# Patient Record
Sex: Female | Born: 1965 | Race: White | Hispanic: No | Marital: Married | Smoking: Never smoker
Health system: Southern US, Community
[De-identification: ages and names within clinical notes are randomized; demographics above are authoritative.]

## PROBLEM LIST (undated history)

## (undated) DIAGNOSIS — D649 Anemia, unspecified: Secondary | ICD-10-CM

## (undated) DIAGNOSIS — F419 Anxiety disorder, unspecified: Secondary | ICD-10-CM

## (undated) DIAGNOSIS — Q639 Congenital malformation of kidney, unspecified: Secondary | ICD-10-CM

## (undated) DIAGNOSIS — M1990 Unspecified osteoarthritis, unspecified site: Secondary | ICD-10-CM

## (undated) DIAGNOSIS — M533 Sacrococcygeal disorders, not elsewhere classified: Secondary | ICD-10-CM

## (undated) DIAGNOSIS — C189 Malignant neoplasm of colon, unspecified: Secondary | ICD-10-CM

## (undated) HISTORY — PX: COLON SURGERY: SHX602

## (undated) HISTORY — PX: ABDOMINAL HYSTERECTOMY: SHX81

---

## 2002-04-02 ENCOUNTER — Encounter: Payer: Self-pay | Admitting: Plastic Surgery

## 2002-04-02 ENCOUNTER — Ambulatory Visit (HOSPITAL_COMMUNITY): Admission: RE | Admit: 2002-04-02 | Discharge: 2002-04-02 | Payer: Self-pay | Admitting: Plastic Surgery

## 2002-04-26 HISTORY — PX: AUGMENTATION MAMMAPLASTY: SUR837

## 2006-09-12 ENCOUNTER — Ambulatory Visit: Payer: Self-pay | Admitting: Obstetrics and Gynecology

## 2006-09-16 ENCOUNTER — Ambulatory Visit: Payer: Self-pay | Admitting: Obstetrics and Gynecology

## 2006-09-17 ENCOUNTER — Emergency Department: Payer: Self-pay | Admitting: Emergency Medicine

## 2009-03-28 ENCOUNTER — Emergency Department (HOSPITAL_COMMUNITY): Admission: EM | Admit: 2009-03-28 | Discharge: 2009-03-28 | Payer: Self-pay | Admitting: Emergency Medicine

## 2009-10-29 ENCOUNTER — Ambulatory Visit: Payer: Self-pay

## 2011-08-04 ENCOUNTER — Ambulatory Visit: Payer: Self-pay | Admitting: Obstetrics and Gynecology

## 2011-12-06 ENCOUNTER — Emergency Department (HOSPITAL_COMMUNITY)
Admission: EM | Admit: 2011-12-06 | Discharge: 2011-12-06 | Discharge status: Against Medical Advice | Payer: Self-pay | Attending: Emergency Medicine | Admitting: Emergency Medicine

## 2011-12-06 DIAGNOSIS — R69 Illness, unspecified: Secondary | ICD-10-CM | POA: Insufficient documentation

## 2011-12-07 MED ORDER — ONDANSETRON 4 MG PO TBDP
ORAL_TABLET | ORAL | Status: AC
  Filled 2011-12-07: qty 1

## 2013-03-06 ENCOUNTER — Ambulatory Visit: Payer: Self-pay | Admitting: Obstetrics and Gynecology

## 2013-04-26 DIAGNOSIS — C189 Malignant neoplasm of colon, unspecified: Secondary | ICD-10-CM

## 2013-04-26 HISTORY — DX: Malignant neoplasm of colon, unspecified: C18.9

## 2013-05-28 ENCOUNTER — Ambulatory Visit: Payer: Self-pay | Admitting: Internal Medicine

## 2013-05-29 LAB — CBC CANCER CENTER
BANDS NEUTROPHIL: 1 %
Basophil: 1 %
EOS PCT: 2 %
HCT: 32.9 % — ABNORMAL LOW (ref 35.0–47.0)
HGB: 10.4 g/dL — ABNORMAL LOW (ref 12.0–16.0)
LYMPHS PCT: 34 %
MCH: 30.3 pg (ref 26.0–34.0)
MCHC: 31.7 g/dL — ABNORMAL LOW (ref 32.0–36.0)
MCV: 95 fL (ref 80–100)
Monocytes: 5 %
Platelet: 313 x10 3/mm (ref 150–440)
RBC: 3.44 10*6/uL — AB (ref 3.80–5.20)
RDW: 13.5 % (ref 11.5–14.5)
Segmented Neutrophils: 55 %
VARIANT LYMPHOCYTE - H4-RLYMPH: 2 %
WBC: 7.6 x10 3/mm (ref 3.6–11.0)

## 2013-05-29 LAB — RETICULOCYTES
Absolute Retic Count: 0.0704 10*6/uL (ref 0.019–0.186)
RETICULOCYTE: 2.04 % (ref 0.4–3.1)

## 2013-05-29 LAB — IRON AND TIBC
IRON: 43 ug/dL — AB (ref 50–170)
Iron Bind.Cap.(Total): 375 ug/dL (ref 250–450)
Iron Saturation: 11 %
Unbound Iron-Bind.Cap.: 332 ug/dL

## 2013-05-29 LAB — FERRITIN: Ferritin (ARMC): 9 ng/mL (ref 8–388)

## 2013-05-29 LAB — FOLATE: FOLIC ACID: 7.9 ng/mL (ref 3.1–100.0)

## 2013-05-29 LAB — LACTATE DEHYDROGENASE: LDH: 140 U/L (ref 81–246)

## 2013-05-29 LAB — SEDIMENTATION RATE: Erythrocyte Sed Rate: 14 mm/hr (ref 0–20)

## 2013-05-31 LAB — PROT IMMUNOELECTROPHORES(ARMC)

## 2013-06-24 ENCOUNTER — Ambulatory Visit: Payer: Self-pay | Admitting: Internal Medicine

## 2013-08-10 ENCOUNTER — Ambulatory Visit: Payer: Self-pay | Admitting: Unknown Physician Specialty

## 2013-08-20 ENCOUNTER — Ambulatory Visit: Payer: Self-pay | Admitting: Gastroenterology

## 2013-08-21 ENCOUNTER — Ambulatory Visit: Payer: Self-pay | Admitting: Internal Medicine

## 2013-08-21 LAB — FERRITIN: Ferritin (ARMC): 11 ng/mL (ref 8–388)

## 2013-08-21 LAB — IRON AND TIBC
Iron Bind.Cap.(Total): 324 ug/dL (ref 250–450)
Iron Saturation: 9 %
Iron: 28 ug/dL — ABNORMAL LOW (ref 50–170)
Unbound Iron-Bind.Cap.: 296 ug/dL

## 2013-08-21 LAB — CANCER CENTER HEMOGLOBIN: HGB: 11 g/dL — AB (ref 12.0–16.0)

## 2013-08-24 ENCOUNTER — Ambulatory Visit: Payer: Self-pay | Admitting: Internal Medicine

## 2013-09-24 ENCOUNTER — Ambulatory Visit: Payer: Self-pay | Admitting: Internal Medicine

## 2013-10-16 ENCOUNTER — Ambulatory Visit: Payer: Self-pay

## 2013-11-05 ENCOUNTER — Ambulatory Visit: Payer: Self-pay | Admitting: Internal Medicine

## 2013-11-05 LAB — CBC CANCER CENTER
Basophil #: 0 x10 3/mm (ref 0.0–0.1)
Basophil %: 0.8 %
Eosinophil #: 0.3 x10 3/mm (ref 0.0–0.7)
Eosinophil %: 4.5 %
HCT: 35.8 % (ref 35.0–47.0)
HGB: 11.5 g/dL — ABNORMAL LOW (ref 12.0–16.0)
Lymphocyte #: 1.7 x10 3/mm (ref 1.0–3.6)
Lymphocyte %: 27.7 %
MCH: 29.8 pg (ref 26.0–34.0)
MCHC: 32.1 g/dL (ref 32.0–36.0)
MCV: 93 fL (ref 80–100)
Monocyte #: 0.4 x10 3/mm (ref 0.2–0.9)
Monocyte %: 6.8 %
Neutrophil #: 3.8 x10 3/mm (ref 1.4–6.5)
Neutrophil %: 60.2 %
Platelet: 219 x10 3/mm (ref 150–440)
RBC: 3.86 10*6/uL (ref 3.80–5.20)
RDW: 13.9 % (ref 11.5–14.5)
WBC: 6.3 x10 3/mm (ref 3.6–11.0)

## 2013-11-05 LAB — FERRITIN: Ferritin (ARMC): 32 ng/mL (ref 8–388)

## 2013-11-05 LAB — IRON AND TIBC
Iron Bind.Cap.(Total): 328 ug/dL (ref 250–450)
Iron Saturation: 23 %
Iron: 77 ug/dL (ref 50–170)
Unbound Iron-Bind.Cap.: 251 ug/dL

## 2013-11-23 ENCOUNTER — Ambulatory Visit: Payer: Self-pay | Admitting: Vascular Surgery

## 2013-11-24 ENCOUNTER — Ambulatory Visit: Payer: Self-pay | Admitting: Internal Medicine

## 2013-11-28 LAB — COMPREHENSIVE METABOLIC PANEL
Albumin: 3.5 g/dL (ref 3.4–5.0)
Alkaline Phosphatase: 57 U/L
Anion Gap: 4 — ABNORMAL LOW (ref 7–16)
BUN: 11 mg/dL (ref 7–18)
Bilirubin,Total: 0.3 mg/dL (ref 0.2–1.0)
Calcium, Total: 8.2 mg/dL — ABNORMAL LOW (ref 8.5–10.1)
Chloride: 108 mmol/L — ABNORMAL HIGH (ref 98–107)
Co2: 27 mmol/L (ref 21–32)
Creatinine: 0.72 mg/dL (ref 0.60–1.30)
EGFR (African American): >60
EGFR (Non-African Amer.): >60
Glucose: 87 mg/dL (ref 65–99)
Osmolality: 276 (ref 275–301)
Potassium: 3.6 mmol/L (ref 3.5–5.1)
SGOT(AST): 19 U/L (ref 15–37)
SGPT (ALT): 18 U/L
Sodium: 139 mmol/L (ref 136–145)
Total Protein: 7.3 g/dL (ref 6.4–8.2)

## 2013-11-28 LAB — CBC CANCER CENTER
Basophil #: 0 x10 3/mm (ref 0.0–0.1)
Basophil %: 0.7 %
Eosinophil #: 0.1 x10 3/mm (ref 0.0–0.7)
Eosinophil %: 2.3 %
HCT: 36.3 % (ref 35.0–47.0)
HGB: 11.9 g/dL — ABNORMAL LOW (ref 12.0–16.0)
Lymphocyte #: 1.5 x10 3/mm (ref 1.0–3.6)
Lymphocyte %: 29.1 %
MCH: 30.1 pg (ref 26.0–34.0)
MCHC: 32.9 g/dL (ref 32.0–36.0)
MCV: 92 fL (ref 80–100)
Monocyte #: 0.4 x10 3/mm (ref 0.2–0.9)
Monocyte %: 7 %
Neutrophil #: 3.2 x10 3/mm (ref 1.4–6.5)
Neutrophil %: 60.9 %
Platelet: 195 x10 3/mm (ref 150–440)
RBC: 3.96 10*6/uL (ref 3.80–5.20)
RDW: 13.9 % (ref 11.5–14.5)
WBC: 5.3 x10 3/mm (ref 3.6–11.0)

## 2013-12-05 LAB — CBC CANCER CENTER
Basophil #: 0 x10 3/mm (ref 0.0–0.1)
Basophil %: 0.9 %
Eosinophil #: 0.3 x10 3/mm (ref 0.0–0.7)
Eosinophil %: 5.6 %
HCT: 37.6 % (ref 35.0–47.0)
HGB: 12.3 g/dL (ref 12.0–16.0)
Lymphocyte #: 1.4 x10 3/mm (ref 1.0–3.6)
Lymphocyte %: 28.8 %
MCH: 29.9 pg (ref 26.0–34.0)
MCHC: 32.6 g/dL (ref 32.0–36.0)
MCV: 92 fL (ref 80–100)
Monocyte #: 0.2 x10 3/mm (ref 0.2–0.9)
Monocyte %: 3.8 %
Neutrophil #: 3 x10 3/mm (ref 1.4–6.5)
Neutrophil %: 60.9 %
Platelet: 167 x10 3/mm (ref 150–440)
RBC: 4.1 10*6/uL (ref 3.80–5.20)
RDW: 13.5 % (ref 11.5–14.5)
WBC: 4.9 x10 3/mm (ref 3.6–11.0)

## 2013-12-05 LAB — CREATININE, SERUM
Creatinine: 0.86 mg/dL (ref 0.60–1.30)
EGFR (African American): >60

## 2013-12-12 LAB — CBC CANCER CENTER
Basophil #: 0 x10 3/mm (ref 0.0–0.1)
Basophil %: 0.5 %
EOS ABS: 0.1 x10 3/mm (ref 0.0–0.7)
Eosinophil %: 2.5 %
HCT: 35.6 % (ref 35.0–47.0)
HGB: 11.7 g/dL — AB (ref 12.0–16.0)
LYMPHS PCT: 25 %
Lymphocyte #: 1.3 x10 3/mm (ref 1.0–3.6)
MCH: 30 pg (ref 26.0–34.0)
MCHC: 32.9 g/dL (ref 32.0–36.0)
MCV: 91 fL (ref 80–100)
MONO ABS: 0.5 x10 3/mm (ref 0.2–0.9)
Monocyte %: 8.5 %
Neutrophil #: 3.4 x10 3/mm (ref 1.4–6.5)
Neutrophil %: 63.5 %
Platelet: 194 x10 3/mm (ref 150–440)
RBC: 3.9 10*6/uL (ref 3.80–5.20)
RDW: 13.6 % (ref 11.5–14.5)
WBC: 5.3 x10 3/mm (ref 3.6–11.0)

## 2013-12-12 LAB — BASIC METABOLIC PANEL
Anion Gap: 9 (ref 7–16)
BUN: 7 mg/dL (ref 7–18)
CO2: 27 mmol/L (ref 21–32)
CREATININE: 0.89 mg/dL (ref 0.60–1.30)
Calcium, Total: 8 mg/dL — ABNORMAL LOW (ref 8.5–10.1)
Chloride: 105 mmol/L (ref 98–107)
EGFR (Non-African Amer.): >60
GLUCOSE: 99 mg/dL (ref 65–99)
OSMOLALITY: 279 (ref 275–301)
Potassium: 3.7 mmol/L (ref 3.5–5.1)
SODIUM: 141 mmol/L (ref 136–145)

## 2013-12-12 LAB — HEPATIC FUNCTION PANEL A (ARMC)
ALBUMIN: 3.5 g/dL (ref 3.4–5.0)
AST: 13 U/L — AB (ref 15–37)
Alkaline Phosphatase: 64 U/L
Bilirubin, Direct: 0.1 mg/dL (ref 0.00–0.20)
Bilirubin,Total: 0.4 mg/dL (ref 0.2–1.0)
SGPT (ALT): 18 U/L
Total Protein: 6.9 g/dL (ref 6.4–8.2)

## 2013-12-19 LAB — CBC CANCER CENTER
Basophil #: 0 x10 3/mm (ref 0.0–0.1)
Basophil %: 1.2 %
Eosinophil #: 0.4 x10 3/mm (ref 0.0–0.7)
Eosinophil %: 10.5 %
HCT: 39.4 % (ref 35.0–47.0)
HGB: 12.9 g/dL (ref 12.0–16.0)
LYMPHS PCT: 33.4 %
Lymphocyte #: 1.3 x10 3/mm (ref 1.0–3.6)
MCH: 29.8 pg (ref 26.0–34.0)
MCHC: 32.7 g/dL (ref 32.0–36.0)
MCV: 91 fL (ref 80–100)
Monocyte #: 0.3 x10 3/mm (ref 0.2–0.9)
Monocyte %: 7.6 %
NEUTROS ABS: 1.8 x10 3/mm (ref 1.4–6.5)
Neutrophil %: 47.3 %
PLATELETS: 171 x10 3/mm (ref 150–440)
RBC: 4.33 10*6/uL (ref 3.80–5.20)
RDW: 14.3 % (ref 11.5–14.5)
WBC: 3.8 x10 3/mm (ref 3.6–11.0)

## 2013-12-19 LAB — CREATININE, SERUM
Creatinine: 0.88 mg/dL (ref 0.60–1.30)
EGFR (Non-African Amer.): >60

## 2013-12-25 ENCOUNTER — Ambulatory Visit: Payer: Self-pay | Admitting: Internal Medicine

## 2013-12-26 LAB — HEPATIC FUNCTION PANEL A (ARMC)
ALBUMIN: 3.5 g/dL (ref 3.4–5.0)
Alkaline Phosphatase: 66 U/L
Bilirubin, Direct: <0.05 mg/dL (ref 0.00–0.20)
Bilirubin,Total: 0.3 mg/dL (ref 0.2–1.0)
SGOT(AST): 13 U/L — ABNORMAL LOW (ref 15–37)
SGPT (ALT): 17 U/L
TOTAL PROTEIN: 7.1 g/dL (ref 6.4–8.2)

## 2013-12-26 LAB — CBC CANCER CENTER
Basophil #: 0 x10 3/mm (ref 0.0–0.1)
Basophil %: 0.7 %
EOS ABS: 0.2 x10 3/mm (ref 0.0–0.7)
Eosinophil %: 4.1 %
HCT: 37.9 % (ref 35.0–47.0)
HGB: 12.2 g/dL (ref 12.0–16.0)
LYMPHS ABS: 1.3 x10 3/mm (ref 1.0–3.6)
Lymphocyte %: 25.3 %
MCH: 29.5 pg (ref 26.0–34.0)
MCHC: 32.1 g/dL (ref 32.0–36.0)
MCV: 92 fL (ref 80–100)
MONO ABS: 0.5 x10 3/mm (ref 0.2–0.9)
MONOS PCT: 9.6 %
NEUTROS PCT: 60.3 %
Neutrophil #: 3.1 x10 3/mm (ref 1.4–6.5)
Platelet: 154 x10 3/mm (ref 150–440)
RBC: 4.13 10*6/uL (ref 3.80–5.20)
RDW: 14.4 % (ref 11.5–14.5)
WBC: 5.2 x10 3/mm (ref 3.6–11.0)

## 2013-12-26 LAB — BASIC METABOLIC PANEL
Anion Gap: 7 (ref 7–16)
BUN: 14 mg/dL (ref 7–18)
CO2: 28 mmol/L (ref 21–32)
Calcium, Total: 8.3 mg/dL — ABNORMAL LOW (ref 8.5–10.1)
Chloride: 106 mmol/L (ref 98–107)
Creatinine: 0.87 mg/dL (ref 0.60–1.30)
EGFR (African American): >60
EGFR (Non-African Amer.): >60
GLUCOSE: 91 mg/dL (ref 65–99)
Osmolality: 281 (ref 275–301)
POTASSIUM: 4.2 mmol/L (ref 3.5–5.1)
Sodium: 141 mmol/L (ref 136–145)

## 2014-01-09 LAB — CBC CANCER CENTER
BASOS ABS: 0 x10 3/mm (ref 0.0–0.1)
BASOS PCT: 0.6 %
EOS ABS: 0.2 x10 3/mm (ref 0.0–0.7)
EOS PCT: 4.3 %
HCT: 37.3 % (ref 35.0–47.0)
HGB: 12.2 g/dL (ref 12.0–16.0)
LYMPHS ABS: 1.3 x10 3/mm (ref 1.0–3.6)
Lymphocyte %: 24.6 %
MCH: 30 pg (ref 26.0–34.0)
MCHC: 32.8 g/dL (ref 32.0–36.0)
MCV: 92 fL (ref 80–100)
MONOS PCT: 10.3 %
Monocyte #: 0.5 x10 3/mm (ref 0.2–0.9)
NEUTROS PCT: 60.2 %
Neutrophil #: 3.2 x10 3/mm (ref 1.4–6.5)
PLATELETS: 93 x10 3/mm — AB (ref 150–440)
RBC: 4.07 10*6/uL (ref 3.80–5.20)
RDW: 15.4 % — AB (ref 11.5–14.5)
WBC: 5.3 x10 3/mm (ref 3.6–11.0)

## 2014-01-09 LAB — HEPATIC FUNCTION PANEL A (ARMC)
ALT: 92 U/L — AB
Albumin: 3.5 g/dL (ref 3.4–5.0)
Alkaline Phosphatase: 78 U/L
BILIRUBIN TOTAL: 0.3 mg/dL (ref 0.2–1.0)
Bilirubin, Direct: 0.1 mg/dL (ref 0.00–0.20)
SGOT(AST): 75 U/L — ABNORMAL HIGH (ref 15–37)
TOTAL PROTEIN: 7.3 g/dL (ref 6.4–8.2)

## 2014-01-09 LAB — BASIC METABOLIC PANEL
Anion Gap: 6 — ABNORMAL LOW (ref 7–16)
BUN: 12 mg/dL (ref 7–18)
CHLORIDE: 106 mmol/L (ref 98–107)
CO2: 27 mmol/L (ref 21–32)
CREATININE: 0.83 mg/dL (ref 0.60–1.30)
Calcium, Total: 8.7 mg/dL (ref 8.5–10.1)
EGFR (Non-African Amer.): >60
GLUCOSE: 97 mg/dL (ref 65–99)
OSMOLALITY: 277 (ref 275–301)
Potassium: 3.7 mmol/L (ref 3.5–5.1)
Sodium: 139 mmol/L (ref 136–145)

## 2014-01-16 LAB — CBC CANCER CENTER
BASOS ABS: 0 x10 3/mm (ref 0.0–0.1)
BASOS PCT: 1.1 %
EOS PCT: 14.9 %
Eosinophil #: 0.6 x10 3/mm (ref 0.0–0.7)
HCT: 39.7 % (ref 35.0–47.0)
HGB: 13.1 g/dL (ref 12.0–16.0)
LYMPHS ABS: 1.2 x10 3/mm (ref 1.0–3.6)
Lymphocyte %: 30.1 %
MCH: 30.2 pg (ref 26.0–34.0)
MCHC: 32.9 g/dL (ref 32.0–36.0)
MCV: 92 fL (ref 80–100)
MONO ABS: 0.3 x10 3/mm (ref 0.2–0.9)
Monocyte %: 6.8 %
Neutrophil #: 1.8 x10 3/mm (ref 1.4–6.5)
Neutrophil %: 47.1 %
Platelet: 31 x10 3/mm — ABNORMAL LOW (ref 150–440)
RBC: 4.33 10*6/uL (ref 3.80–5.20)
RDW: 16 % — ABNORMAL HIGH (ref 11.5–14.5)
WBC: 3.9 x10 3/mm (ref 3.6–11.0)

## 2014-01-16 LAB — HEPATIC FUNCTION PANEL A (ARMC)
ALK PHOS: 80 U/L
Albumin: 3.6 g/dL (ref 3.4–5.0)
BILIRUBIN DIRECT: 0.1 mg/dL (ref 0.00–0.20)
Bilirubin,Total: 0.4 mg/dL (ref 0.2–1.0)
SGOT(AST): 48 U/L — ABNORMAL HIGH (ref 15–37)
SGPT (ALT): 91 U/L — ABNORMAL HIGH
Total Protein: 7.2 g/dL (ref 6.4–8.2)

## 2014-01-23 LAB — CBC CANCER CENTER
Basophil #: 0 x10 3/mm (ref 0.0–0.1)
Basophil %: 0.8 %
EOS PCT: 5.9 %
Eosinophil #: 0.2 x10 3/mm (ref 0.0–0.7)
HCT: 37.9 % (ref 35.0–47.0)
HGB: 12.3 g/dL (ref 12.0–16.0)
Lymphocyte #: 1.3 x10 3/mm (ref 1.0–3.6)
Lymphocyte %: 31.3 %
MCH: 30 pg (ref 26.0–34.0)
MCHC: 32.4 g/dL (ref 32.0–36.0)
MCV: 93 fL (ref 80–100)
Monocyte #: 0.5 x10 3/mm (ref 0.2–0.9)
Monocyte %: 11.8 %
Neutrophil #: 2 x10 3/mm (ref 1.4–6.5)
Neutrophil %: 50.2 %
Platelet: 73 x10 3/mm — ABNORMAL LOW (ref 150–440)
RBC: 4.09 10*6/uL (ref 3.80–5.20)
RDW: 17.3 % — ABNORMAL HIGH (ref 11.5–14.5)
WBC: 4.1 x10 3/mm (ref 3.6–11.0)

## 2014-01-23 LAB — BASIC METABOLIC PANEL
Anion Gap: 11 (ref 7–16)
BUN: 10 mg/dL (ref 7–18)
CREATININE: 0.8 mg/dL (ref 0.60–1.30)
Calcium, Total: 9 mg/dL (ref 8.5–10.1)
Chloride: 104 mmol/L (ref 98–107)
Co2: 26 mmol/L (ref 21–32)
EGFR (African American): >60
EGFR (Non-African Amer.): >60
Glucose: 92 mg/dL (ref 65–99)
OSMOLALITY: 280 (ref 275–301)
Potassium: 3.9 mmol/L (ref 3.5–5.1)
Sodium: 141 mmol/L (ref 136–145)

## 2014-01-23 LAB — HEPATIC FUNCTION PANEL A (ARMC)
ALBUMIN: 4 g/dL (ref 3.4–5.0)
ALK PHOS: 75 U/L
AST: 68 U/L — AB (ref 15–37)
BILIRUBIN DIRECT: 0.1 mg/dL (ref 0.00–0.20)
Bilirubin,Total: 0.4 mg/dL (ref 0.2–1.0)
SGPT (ALT): 103 U/L — ABNORMAL HIGH
TOTAL PROTEIN: 7.6 g/dL (ref 6.4–8.2)

## 2014-01-24 ENCOUNTER — Ambulatory Visit: Payer: Self-pay | Admitting: Internal Medicine

## 2014-01-30 LAB — CBC CANCER CENTER
BASOS ABS: 0 x10 3/mm (ref 0.0–0.1)
Basophil %: 1.4 %
Eosinophil #: 0.1 x10 3/mm (ref 0.0–0.7)
Eosinophil %: 4.9 %
HCT: 40 % (ref 35.0–47.0)
HGB: 13.1 g/dL (ref 12.0–16.0)
LYMPHS ABS: 1.2 x10 3/mm (ref 1.0–3.6)
Lymphocyte %: 41.4 %
MCH: 30.9 pg (ref 26.0–34.0)
MCHC: 32.8 g/dL (ref 32.0–36.0)
MCV: 94 fL (ref 80–100)
MONO ABS: 0.4 x10 3/mm (ref 0.2–0.9)
MONOS PCT: 14.3 %
NEUTROS PCT: 38 %
Neutrophil #: 1.1 x10 3/mm — ABNORMAL LOW (ref 1.4–6.5)
Platelet: 121 x10 3/mm — ABNORMAL LOW (ref 150–440)
RBC: 4.24 10*6/uL (ref 3.80–5.20)
RDW: 19.1 % — AB (ref 11.5–14.5)
WBC: 2.9 x10 3/mm — ABNORMAL LOW (ref 3.6–11.0)

## 2014-01-30 LAB — HEPATIC FUNCTION PANEL A (ARMC)
ALBUMIN: 4.1 g/dL (ref 3.4–5.0)
ALK PHOS: 71 U/L
ALT: 97 U/L — AB
AST: 45 U/L — AB (ref 15–37)
BILIRUBIN DIRECT: 0.1 mg/dL (ref 0.00–0.20)
Bilirubin,Total: 0.5 mg/dL (ref 0.2–1.0)
TOTAL PROTEIN: 7.6 g/dL (ref 6.4–8.2)

## 2014-01-30 LAB — CREATININE, SERUM
CREATININE: 0.82 mg/dL (ref 0.60–1.30)
EGFR (African American): >60
EGFR (Non-African Amer.): >60

## 2014-02-20 LAB — CBC CANCER CENTER
BASOS ABS: 0 x10 3/mm (ref 0.0–0.1)
Basophil %: 0.8 %
EOS ABS: 0.2 x10 3/mm (ref 0.0–0.7)
Eosinophil %: 3.4 %
HCT: 35.8 % (ref 35.0–47.0)
HGB: 11.7 g/dL — AB (ref 12.0–16.0)
LYMPHS ABS: 1.3 x10 3/mm (ref 1.0–3.6)
LYMPHS PCT: 27.8 %
MCH: 31.8 pg (ref 26.0–34.0)
MCHC: 32.6 g/dL (ref 32.0–36.0)
MCV: 98 fL (ref 80–100)
MONO ABS: 0.3 x10 3/mm (ref 0.2–0.9)
Monocyte %: 6.7 %
NEUTROS ABS: 2.9 x10 3/mm (ref 1.4–6.5)
NEUTROS PCT: 61.3 %
Platelet: 178 x10 3/mm (ref 150–440)
RBC: 3.67 10*6/uL — ABNORMAL LOW (ref 3.80–5.20)
RDW: 17.6 % — ABNORMAL HIGH (ref 11.5–14.5)
WBC: 4.7 x10 3/mm (ref 3.6–11.0)

## 2014-02-20 LAB — HEPATIC FUNCTION PANEL A (ARMC)
Albumin: 3.9 g/dL (ref 3.4–5.0)
Alkaline Phosphatase: 70 U/L
BILIRUBIN DIRECT: 0.1 mg/dL (ref 0.0–0.2)
BILIRUBIN TOTAL: 0.6 mg/dL (ref 0.2–1.0)
SGOT(AST): 23 U/L (ref 15–37)
SGPT (ALT): 26 U/L
Total Protein: 7.5 g/dL (ref 6.4–8.2)

## 2014-02-20 LAB — IRON AND TIBC
IRON BIND. CAP.(TOTAL): 299 ug/dL (ref 250–450)
Iron Saturation: 36 %
Iron: 109 ug/dL (ref 50–170)
UNBOUND IRON-BIND. CAP.: 190 ug/dL

## 2014-02-21 LAB — CEA: CEA: 1.3 ng/mL (ref 0.0–4.7)

## 2014-02-24 ENCOUNTER — Ambulatory Visit: Payer: Self-pay | Admitting: Internal Medicine

## 2014-04-02 ENCOUNTER — Ambulatory Visit: Payer: Self-pay | Admitting: Internal Medicine

## 2014-04-26 ENCOUNTER — Ambulatory Visit: Payer: Self-pay | Admitting: Internal Medicine

## 2014-04-26 HISTORY — PX: PORT-A-CATH REMOVAL: SHX5289

## 2014-05-29 ENCOUNTER — Ambulatory Visit: Payer: Self-pay | Admitting: Internal Medicine

## 2014-05-29 LAB — IRON AND TIBC
Iron Bind.Cap.(Total): 302 ug/dL (ref 250–450)
Iron Saturation: 39 %
Iron: 117 ug/dL (ref 50–170)
UNBOUND IRON-BIND. CAP.: 185 ug/dL

## 2014-05-29 LAB — CBC CANCER CENTER
BASOS ABS: 0 x10 3/mm (ref 0.0–0.1)
Basophil %: 0.7 %
Eosinophil #: 0.1 x10 3/mm (ref 0.0–0.7)
Eosinophil %: 1.7 %
HCT: 37.1 % (ref 35.0–47.0)
HGB: 12.4 g/dL (ref 12.0–16.0)
Lymphocyte #: 1.5 x10 3/mm (ref 1.0–3.6)
Lymphocyte %: 24.5 %
MCH: 31.3 pg (ref 26.0–34.0)
MCHC: 33.5 g/dL (ref 32.0–36.0)
MCV: 94 fL (ref 80–100)
MONOS PCT: 5.7 %
Monocyte #: 0.4 x10 3/mm (ref 0.2–0.9)
Neutrophil #: 4.3 x10 3/mm (ref 1.4–6.5)
Neutrophil %: 67.4 %
PLATELETS: 196 x10 3/mm (ref 150–440)
RBC: 3.97 10*6/uL (ref 3.80–5.20)
RDW: 12.3 % (ref 11.5–14.5)
WBC: 6.3 x10 3/mm (ref 3.6–11.0)

## 2014-05-29 LAB — HEPATIC FUNCTION PANEL A (ARMC)
AST: 15 U/L (ref 15–37)
Albumin: 3.8 g/dL (ref 3.4–5.0)
Alkaline Phosphatase: 61 U/L (ref 46–116)
Bilirubin, Direct: 0.1 mg/dL (ref 0.0–0.2)
Bilirubin,Total: 0.5 mg/dL (ref 0.2–1.0)
SGPT (ALT): 23 U/L (ref 14–63)
Total Protein: 7.2 g/dL (ref 6.4–8.2)

## 2014-05-29 LAB — FOLATE: Folic Acid: 19.6 ng/mL — ABNORMAL HIGH (ref 3.1–17.5)

## 2014-05-29 LAB — CREATININE, SERUM
Creatinine: 0.66 mg/dL (ref 0.60–1.30)
EGFR (African American): >60

## 2014-05-29 LAB — FERRITIN: Ferritin (ARMC): 30 ng/mL (ref 8–388)

## 2014-05-30 LAB — CEA: CEA: 0.9 ng/mL (ref 0.0–4.7)

## 2014-06-25 ENCOUNTER — Ambulatory Visit
Admit: 2014-06-25 | Disposition: A | Discharge status: Home / Self Care | Payer: Self-pay | Attending: Internal Medicine | Admitting: Internal Medicine

## 2014-08-09 ENCOUNTER — Other Ambulatory Visit: Payer: Self-pay | Admitting: Internal Medicine

## 2014-08-09 DIAGNOSIS — C189 Malignant neoplasm of colon, unspecified: Secondary | ICD-10-CM

## 2014-08-17 NOTE — Op Note (Signed)
PATIENT NAME:  Brittany Horne, Brittany Horne MR#:  [id number] DATE OF BIRTH:  04-28-1965  DATE OF PROCEDURE:  11/23/2013  PREOPERATIVE DIAGNOSIS: Colon cancer.   POSTOPERATIVE DIAGNOSIS:  Colon cancer.  PROCEDURE PERFORMED: Insertion of right internal jugular Infuse-a-Port with ultrasound and fluoroscopic guidance.   SURGEON:  Hortencia Pilar, MD.   SEDATION: Versed 7 mg plus fentanyl 300 mcg administered IV. Continuous ECG, pulse oximetry and cardiopulmonary monitoring is performed throughout the entire procedure by the interventional radiology nurse. Total sedation time was 25 minutes.   ACCESS: Right IJ.   FLUOROSCOPY TIME: Approximately 1 minute.   CONTRAST: None.   INDICATIONS: Brittany Horne is a 49 year old woman who presents with a recent diagnosis of colon cancer and is undergoing adjuvant chemotherapy. She, therefore, requires adequate IV access.  Risks and benefits were reviewed. All questions answered. The patient has agreed to proceed with port placement.   DESCRIPTION OF PROCEDURE: The patient is taken to the special procedure suite, placed in the supine position. After adequate sedation is achieved, her right neck and chest wall are prepped and draped in a sterile fashion. Lidocaine 1% is then infiltrated in the soft tissues on the chest wall, as well as at the base of the neck. Ultrasound is placed in a sterile sleeve. Jugular vein is identified. It is echolucent and compressible indicating patency. Images recorded. Under real-time visualization, microneedle is inserted into the jugular vein. Microwire is advanced, followed by the micro sheath. A small incision is made at the wire insertion site and a pocket created with blunt dissection. Linear incision is then made 3 fingerbreadths below the clavicle just to the left of the midline and a pocket fashioned with both blunt and sharp dissection. It is tested for appropriate size and, subsequently, the catheter is pulled subcutaneously. A J-wire is  then advanced through the micro sheath, followed by the dilator with the peel-away sheath. Wire and dilator are removed, and the catheter is inserted into the central venous system. Under fluoroscopic guidance, the catheter is positioned with its tip at the atrio-caval junction. It is then transected and the hub is connected.  It is slipped into the pocket without difficulty. The port is then accessed percutaneously with a Huber needle. It aspirates well and flushes easily, and under fluoroscopy, it a smooth contour with the tip in good position.   The subcutaneous tissues are then reapproximated with interrupted 3-0 Vicryl and the skin is closed with 4-0 Monocryl subcuticular. The counterincision is closed with 4-0 Monocryl subcuticular.  Dermabond is applied. The patient tolerated the procedure well. There were no immediate complications, and she is taken to the recovery area in excellent condition.   ____________________________ Katha Cabal, MD ggs:ts D: 11/23/2013 11:52:56 ET T: 11/23/2013 13:41:55 ET JOB#: [id number]  cc: Katha Cabal, MD, <Dictator> Sandeep R. Ma Hillock, MD Ms. Rosine Door Katha Cabal MD ELECTRONICALLY SIGNED 12/04/2013 17:16

## 2014-08-21 ENCOUNTER — Ambulatory Visit
Admit: 2014-08-21 | Disposition: A | Discharge status: Home / Self Care | Payer: Self-pay | Attending: Internal Medicine | Admitting: Internal Medicine

## 2014-09-05 ENCOUNTER — Ambulatory Visit: Payer: No Typology Code available for payment source | Admitting: Anesthesiology

## 2014-09-05 ENCOUNTER — Encounter: Payer: Self-pay | Admitting: *Deleted

## 2014-09-05 ENCOUNTER — Encounter
Admission: RE | Disposition: A | Discharge status: Home / Self Care | Payer: Self-pay | Source: Ambulatory Visit | Attending: Gastroenterology

## 2014-09-05 ENCOUNTER — Ambulatory Visit
Admission: RE | Admit: 2014-09-05 | Discharge: 2014-09-05 | Disposition: A | Discharge status: Home / Self Care | Payer: No Typology Code available for payment source | Source: Ambulatory Visit | Attending: Gastroenterology | Admitting: Gastroenterology

## 2014-09-05 DIAGNOSIS — Z9221 Personal history of antineoplastic chemotherapy: Secondary | ICD-10-CM | POA: Insufficient documentation

## 2014-09-05 DIAGNOSIS — Z79899 Other long term (current) drug therapy: Secondary | ICD-10-CM | POA: Diagnosis not present

## 2014-09-05 DIAGNOSIS — Z85038 Personal history of other malignant neoplasm of large intestine: Secondary | ICD-10-CM | POA: Diagnosis present

## 2014-09-05 DIAGNOSIS — Z90710 Acquired absence of both cervix and uterus: Secondary | ICD-10-CM | POA: Diagnosis not present

## 2014-09-05 DIAGNOSIS — K635 Polyp of colon: Secondary | ICD-10-CM | POA: Diagnosis not present

## 2014-09-05 HISTORY — PX: COLONOSCOPY WITH PROPOFOL: SHX5780

## 2014-09-05 SURGERY — COLONOSCOPY WITH PROPOFOL
Anesthesia: General

## 2014-09-05 MED ORDER — PROPOFOL 10 MG/ML IV BOLUS
INTRAVENOUS | Status: DC | PRN
  Administered 2014-09-05: 50 mg via INTRAVENOUS

## 2014-09-05 MED ORDER — PROPOFOL INFUSION 10 MG/ML OPTIME
INTRAVENOUS | Status: DC | PRN
  Administered 2014-09-05: 75 ug/kg/min via INTRAVENOUS

## 2014-09-05 MED ORDER — SODIUM CHLORIDE 0.9 % IV SOLN
INTRAVENOUS | Status: DC
  Administered 2014-09-05: 08:00:00 via INTRAVENOUS
  Administered 2014-09-05: 1000 mL via INTRAVENOUS

## 2014-09-05 MED ORDER — LIDOCAINE HCL (CARDIAC) 20 MG/ML IV SOLN
INTRAVENOUS | Status: DC | PRN
Start: 2014-09-05 — End: 2014-09-05
  Administered 2014-09-05: 50 mg via INTRAVENOUS

## 2014-09-05 MED ORDER — FENTANYL CITRATE (PF) 100 MCG/2ML IJ SOLN
INTRAMUSCULAR | Status: DC | PRN
  Administered 2014-09-05: 50 ug via INTRAVENOUS

## 2014-09-05 NOTE — Anesthesia Postprocedure Evaluation (Signed)
  Anesthesia Post-op Note  Patient: Brittany Horne  Procedure(s) Performed: Procedure(s): COLONOSCOPY WITH PROPOFOL (N/A)  Anesthesia type:General  Patient location: PACU  Post pain: Pain level controlled  Post assessment: Post-op Vital signs reviewed, Patient's Cardiovascular Status Stable, Respiratory Function Stable, Patent Airway and No signs of Nausea or vomiting  Post vital signs: Reviewed and stable  Last Vitals:  Filed Vitals:   09/05/14 0930  BP: 115/80  Pulse: 63  Temp:   Resp: 12    Level of consciousness: awake, alert  and patient cooperative  Complications: No apparent anesthesia complications

## 2014-09-05 NOTE — Discharge Instructions (Signed)

## 2014-09-05 NOTE — Transfer of Care (Signed)
Immediate Anesthesia Transfer of Care Note  Patient: Brittany Horne  Procedure(s) Performed: Procedure(s): COLONOSCOPY WITH PROPOFOL (N/A)  Patient Location: PACU and Endoscopy Unit  Anesthesia Type:General  Level of Consciousness: awake, alert  and oriented  Airway & Oxygen Therapy: Patient Spontanous Breathing and Patient connected to nasal cannula oxygen  Post-op Assessment: Report given to RN and Post -op Vital signs reviewed and stable  Post vital signs: Reviewed and stable  Last Vitals:  Filed Vitals:   09/05/14 0901  BP: 111/54  Pulse: 74  Temp: 35.7 C  Resp: 14    Complications: No apparent anesthesia complications

## 2014-09-05 NOTE — Op Note (Signed)
[facility] Gastroenterology Patient Name: Brittany Horne Procedure Date: 09/05/2014 7:49 AM MRN: [id number] Account #: [id number] Date of Birth: 10/29/1965 Admit Type: Outpatient Age: 49 Room: [facility] ENDO ROOM 1 Gender: Female Note Status: Finalized Procedure:         Colonoscopy Indications:       High risk colon cancer surveillance: Personal history of                     colon cancer (cecum, diagnosed 08/2013) Patient Profile:   This is a 49 year old female. Providers:         Gerrit Heck. Rayann Heman, MD Referring MD:      Irven Easterly. Kary Kos, MD (Referring MD) Medicines:         Propofol per Anesthesia Complications:     No immediate complications. Procedure:         Pre-Anesthesia Assessment:                    - Prior to the procedure, a History and Physical was                     performed, and patient medications and allergies were                     reviewed. The patient is competent. The risks and benefits                     of the procedure and the sedation options and risks were                     discussed with the patient. All questions were answered                     and informed consent was obtained. Patient identification                     and proposed procedure were verified by the physician and                     the nurse in the pre-procedure area. Mental Status                     Examination: alert and oriented. Airway Examination:                     normal oropharyngeal airway and neck mobility. Respiratory                     Examination: clear to auscultation. CV Examination: RRR,                     no murmurs, no S3 or S4. Prophylactic Antibiotics: The                     patient does not require prophylactic antibiotics. Prior                     Anticoagulants: The patient has taken no previous                     anticoagulant or antiplatelet agents. ASA Grade                     Assessment: II - A patient with mild systemic  disease.                  After reviewing the risks and benefits, the patient was                     deemed in satisfactory condition to undergo the procedure.                     The anesthesia plan was to use monitored anesthesia care                     (MAC). Immediately prior to administration of medications,                     the patient was re-assessed for adequacy to receive                     sedatives. The heart rate, respiratory rate, oxygen                     saturations, blood pressure, adequacy of pulmonary                     ventilation, and response to care were monitored                     throughout the procedure. The physical status of the                     patient was re-assessed after the procedure.                    After obtaining informed consent, the colonoscope was                     passed under direct vision. Throughout the procedure, the                     patient's blood pressure, pulse, and oxygen saturations                     were monitored continuously. The Colonoscope was                     introduced through the anus and advanced to the the                     ileocolonic anastomosis. The colonoscopy was somewhat                     difficult due to a tortuous colon. Successful completion                     of the procedure was aided by changing the patient to a                     supine position. The patient tolerated the procedure well.                     The quality of the bowel preparation was excellent. Findings:      The perianal and digital rectal examinations were normal.      A 2 mm polyp was found in the recto-sigmoid colon. The polyp was       sessile. The polyp was removed with a jumbo cold forceps. Resection and  retrieval were complete.      There was evidence of a prior end-to-side ileo-colonic anastomosis in       the transverse colon. This was patent. This was characterized by healthy       appearing mucosa. This was  traversed.      The exam was otherwise without abnormality on direct and retroflexion       views. Impression:        - One 2 mm polyp at the recto-sigmoid colon. Resected and                     retrieved.                    - Patent end-to-side ileo-colonic anastomosis,                     characterized by healthy appearing mucosa.                    - The examination was otherwise normal on direct and                     retroflexion views. Recommendation:    - Observe patient in GI recovery unit.                    - High fiber diet.                    - Continue present medications.                    - Await pathology results.                    - Repeat colonoscopy in 2 years for surveillance.                    - Return to referring physician.                    - The findings and recommendations were discussed with the                     patient.                    - The findings and recommendations were discussed with the                     patient's family. Procedure Code(s): --- Professional ---                    [phone], Colonoscopy, flexible; with biopsy, single or                     multiple CPT copyright 2014 [employer]. All rights reserved. The codes documented in this report are preliminary and upon coder review may  be revised to meet current compliance requirements. Mellody Life, MD 09/05/2014 8:57:21 AM This report has been signed electronically. Number of Addenda: 0 Note Initiated On: 09/05/2014 7:49 AM Scope Withdrawal Time: 0 hours 12 minutes 25 seconds  Total Procedure Duration: 0 hours 23 minutes 37 seconds       [facility]

## 2014-09-05 NOTE — Anesthesia Preprocedure Evaluation (Signed)
Anesthesia Evaluation  Patient identified by MRN, date of birth, ID band Patient awake    Reviewed: Allergy & Precautions, H&P , NPO status , Patient's Chart, lab work & pertinent test results, reviewed documented beta blocker date and time   Airway Mallampati: II  TM Distance: >3 FB Neck ROM: full    Dental no notable dental hx.    Pulmonary neg pulmonary ROS,  breath sounds clear to auscultation  Pulmonary exam normal       Cardiovascular Exercise Tolerance: Good negative cardio ROS  Rhythm:regular Rate:Normal     Neuro/Psych negative neurological ROS  negative psych ROS   GI/Hepatic negative GI ROS, Neg liver ROS,   Endo/Other  negative endocrine ROS  Renal/GU negative Renal ROS  negative genitourinary   Musculoskeletal   Abdominal   Peds  Hematology negative hematology ROS (+)   Anesthesia Other Findings   Reproductive/Obstetrics negative OB ROS                             Anesthesia Physical Anesthesia Plan  ASA: II  Anesthesia Plan: General   Post-op Pain Management:    Induction:   Airway Management Planned:   Additional Equipment:   Intra-op Plan:   Post-operative Plan:   Informed Consent: I have reviewed the patients History and Physical, chart, labs and discussed the procedure including the risks, benefits and alternatives for the proposed anesthesia with the patient or authorized representative who has indicated his/her understanding and acceptance.   Dental Advisory Given  Plan Discussed with: CRNA  Anesthesia Plan Comments:         Anesthesia Quick Evaluation

## 2014-09-05 NOTE — H&P (Signed)
Primary Care Physician:  [place] Pink, MD  Pre-Procedure History & Physical: HPI:  Brittany Horne is a 49 y.o. female is here for an colonoscopy.   Past Medical History  Diagnosis Date  . Cancer     colon cancer    Past Surgical History  Procedure Laterality Date  . Colon surgery    . Abdominal hysterectomy    . Cesarean section      Prior to Admission medications   Medication Sig Start Date End Date Taking? Authorizing Provider  Ascorbic Acid (VITAMIN C PO) Take 1 tablet by mouth daily.   Yes Historical Provider, MD  b complex vitamins tablet Take 1 tablet by mouth daily.   Yes Historical Provider, MD  BIOTIN PO Take 1 Dose by mouth daily.   Yes Historical Provider, MD  buPROPion (WELLBUTRIN XL) 150 MG 24 hr tablet Take 150 mg by mouth daily.   Yes Historical Provider, MD  cholecalciferol (VITAMIN D) 1000 UNITS tablet Take 1,000 Units by mouth daily.   Yes Historical Provider, MD  CO-ENZYME Q10 PO Take 1 Dose by mouth daily.   Yes Historical Provider, MD  estradiol (CLIMARA - DOSED IN MG/24 HR) 0.075 mg/24hr patch Place 0.075 mg onto the skin once a week.   Yes Historical Provider, MD  NON FORMULARY Take 1 Dose by mouth daily. Pregnenolone   Yes Historical Provider, MD  polyethylene glycol powder (GLYCOLAX/MIRALAX) powder Take 1 Container by mouth once.   Yes Historical Provider, MD  Probiotic Product (PROBIOTIC DAILY PO) Take 1 tablet by mouth daily.   Yes Historical Provider, MD  progesterone (PROMETRIUM) 100 MG capsule Take 100 mg by mouth daily.   Yes Historical Provider, MD  SELENIUM PO Take 1 Dose by mouth daily.   Yes Historical Provider, MD  TURMERIC PO Take 1 Dose by mouth daily.   Yes Historical Provider, MD  VITAMIN K, PHYTONADIONE, PO Take 1 tablet by mouth daily.    Yes Historical Provider, MD    Allergies as of 09/03/2014  . (No Known Allergies)    History reviewed. No pertinent family history.  History   Social History  . Marital Status: Married   Spouse Name: N/A  . Number of Children: N/A  . Years of Education: N/A   Occupational History  . Not on file.   Social History Main Topics  . Smoking status: Never Smoker   . Smokeless tobacco: Not on file  . Alcohol Use: Not on file  . Drug Use: Not on file  . Sexual Activity: Not on file   Other Topics Concern  . Not on file   Social History Narrative  . No narrative on file     Physical Exam: BP 113/61 mmHg  Pulse 73  Temp(Src) 97.8 F (36.6 C) (Tympanic)  Resp 20  Ht 5\' 7"  (1.702 m)  Wt 146 lb (66.225 kg)  BMI 22.86 kg/m2  SpO2 100% General:   Alert,  pleasant and cooperative in NAD Head:  Normocephalic and atraumatic. Neck:  Supple; no masses or thyromegaly. Lungs:  Clear throughout to auscultation.    Heart:  Regular rate and rhythm. Abdomen:  Soft, nontender and nondistended. Normal bowel sounds, without guarding, and without rebound.   Neurologic:  Alert and  oriented x4;  grossly normal neurologically.  Impression/Plan: Brittany Horne is here for an colonoscopy to be performed for colon cancer sureillance  Risks, benefits, limitations, and alternatives regarding  colonoscopy have been reviewed with the patient.  Questions have  been answered.  All parties agreeable.   Josefine Class, MD  09/05/2014, 8:16 AM

## 2014-09-06 LAB — SURGICAL PATHOLOGY

## 2014-09-19 ENCOUNTER — Other Ambulatory Visit: Payer: Self-pay | Admitting: Obstetrics and Gynecology

## 2014-09-19 DIAGNOSIS — Z13820 Encounter for screening for osteoporosis: Secondary | ICD-10-CM

## 2014-09-19 DIAGNOSIS — Z1231 Encounter for screening mammogram for malignant neoplasm of breast: Secondary | ICD-10-CM

## 2014-09-23 ENCOUNTER — Other Ambulatory Visit: Payer: Self-pay | Admitting: *Deleted

## 2014-09-23 DIAGNOSIS — C189 Malignant neoplasm of colon, unspecified: Secondary | ICD-10-CM

## 2014-09-23 DIAGNOSIS — D509 Iron deficiency anemia, unspecified: Secondary | ICD-10-CM

## 2014-09-23 DIAGNOSIS — Z95828 Presence of other vascular implants and grafts: Secondary | ICD-10-CM

## 2014-09-24 ENCOUNTER — Other Ambulatory Visit: Payer: Self-pay | Admitting: *Deleted

## 2014-09-24 DIAGNOSIS — C189 Malignant neoplasm of colon, unspecified: Secondary | ICD-10-CM

## 2014-09-24 DIAGNOSIS — D509 Iron deficiency anemia, unspecified: Secondary | ICD-10-CM

## 2014-09-25 ENCOUNTER — Inpatient Hospital Stay: Payer: No Typology Code available for payment source

## 2014-09-25 ENCOUNTER — Inpatient Hospital Stay: Payer: No Typology Code available for payment source | Attending: Internal Medicine

## 2014-09-25 ENCOUNTER — Ambulatory Visit
Admission: RE | Admit: 2014-09-25 | Discharge: 2014-09-25 | Disposition: A | Discharge status: Home / Self Care | Payer: No Typology Code available for payment source | Source: Ambulatory Visit | Attending: Internal Medicine | Admitting: Internal Medicine

## 2014-09-25 ENCOUNTER — Telehealth: Payer: Self-pay | Admitting: *Deleted

## 2014-09-25 DIAGNOSIS — Z803 Family history of malignant neoplasm of breast: Secondary | ICD-10-CM | POA: Diagnosis not present

## 2014-09-25 DIAGNOSIS — C189 Malignant neoplasm of colon, unspecified: Secondary | ICD-10-CM | POA: Insufficient documentation

## 2014-09-25 DIAGNOSIS — C801 Malignant (primary) neoplasm, unspecified: Secondary | ICD-10-CM

## 2014-09-25 DIAGNOSIS — Z85038 Personal history of other malignant neoplasm of large intestine: Secondary | ICD-10-CM | POA: Diagnosis present

## 2014-09-25 DIAGNOSIS — Z08 Encounter for follow-up examination after completed treatment for malignant neoplasm: Secondary | ICD-10-CM | POA: Insufficient documentation

## 2014-09-25 DIAGNOSIS — Z9221 Personal history of antineoplastic chemotherapy: Secondary | ICD-10-CM | POA: Insufficient documentation

## 2014-09-25 DIAGNOSIS — D509 Iron deficiency anemia, unspecified: Secondary | ICD-10-CM

## 2014-09-25 DIAGNOSIS — Z9889 Other specified postprocedural states: Secondary | ICD-10-CM | POA: Insufficient documentation

## 2014-09-25 DIAGNOSIS — Z90710 Acquired absence of both cervix and uterus: Secondary | ICD-10-CM | POA: Insufficient documentation

## 2014-09-25 DIAGNOSIS — Z79899 Other long term (current) drug therapy: Secondary | ICD-10-CM | POA: Insufficient documentation

## 2014-09-25 LAB — CREATININE, SERUM
Creatinine, Ser: 0.55 mg/dL (ref 0.44–1.00)
GFR calc Af Amer: >60 mL/min (ref >60)

## 2014-09-25 LAB — HEPATIC FUNCTION PANEL
ALT: 17 U/L (ref 14–54)
AST: 22 U/L (ref 15–41)
Albumin: 4.4 g/dL (ref 3.5–5.0)
Alkaline Phosphatase: 60 U/L (ref 38–126)
Total Bilirubin: 0.6 mg/dL (ref 0.3–1.2)
Total Protein: 7.6 g/dL (ref 6.5–8.1)

## 2014-09-25 LAB — CBC WITH DIFFERENTIAL/PLATELET
BASOS ABS: 0 10*3/uL (ref 0–0.1)
Basophils Relative: 1 %
EOS PCT: 2 %
Eosinophils Absolute: 0.1 10*3/uL (ref 0–0.7)
HCT: 37.7 % (ref 35.0–47.0)
Hemoglobin: 12.4 g/dL (ref 12.0–16.0)
LYMPHS ABS: 1.9 10*3/uL (ref 1.0–3.6)
Lymphocytes Relative: 27 %
MCH: 31 pg (ref 26.0–34.0)
MCHC: 33 g/dL (ref 32.0–36.0)
MCV: 93.9 fL (ref 80.0–100.0)
MONOS PCT: 5 %
Monocytes Absolute: 0.3 10*3/uL (ref 0.2–0.9)
Neutro Abs: 4.7 10*3/uL (ref 1.4–6.5)
Neutrophils Relative %: 67 %
Platelets: 197 10*3/uL (ref 150–440)
RBC: 4.01 MIL/uL (ref 3.80–5.20)
RDW: 12.3 % (ref 11.5–14.5)
WBC: 7 10*3/uL (ref 3.6–11.0)

## 2014-09-25 LAB — IRON AND TIBC
Iron: 84 ug/dL (ref 28–170)
Saturation Ratios: 24 % (ref 10.4–31.8)
TIBC: 350 ug/dL (ref 250–450)
UIBC: 266 ug/dL

## 2014-09-25 MED ORDER — SODIUM CHLORIDE 0.9 % IJ SOLN
10.0000 mL | INTRAMUSCULAR | Status: AC | PRN
  Filled 2014-09-25: qty 10

## 2014-09-25 MED ORDER — IOHEXOL 300 MG/ML  SOLN
100.0000 mL | Freq: Once | INTRAMUSCULAR | Status: AC | PRN
  Administered 2014-09-25: 100 mL via INTRAVENOUS

## 2014-09-25 MED ORDER — HEPARIN SOD (PORK) LOCK FLUSH 100 UNIT/ML IV SOLN
500.0000 [IU] | Freq: Once | INTRAVENOUS | Status: AC
  Administered 2014-09-25: 500 [IU] via INTRAVENOUS
  Filled 2014-09-25: qty 5

## 2014-09-25 MED ORDER — HEPARIN SOD (PORK) LOCK FLUSH 100 UNIT/ML IV SOLN
500.0000 [IU] | Freq: Once | INTRAVENOUS | Status: AC
Start: 2014-09-25 — End: 2014-09-25

## 2014-09-25 MED ORDER — SODIUM CHLORIDE 0.9 % IJ SOLN: 10.0000 mL | INTRAMUSCULAR | Status: DC | PRN

## 2014-09-25 MED ORDER — HEPARIN SOD (PORK) LOCK FLUSH 100 UNIT/ML IV SOLN
500.0000 [IU] | Freq: Once | INTRAVENOUS | Status: AC
  Administered 2014-09-25: 500 [IU] via INTRAVENOUS

## 2014-09-25 MED ORDER — SODIUM CHLORIDE 0.9 % IJ SOLN
10.0000 mL | INTRAMUSCULAR | Status: AC | PRN
  Administered 2014-09-25: 10 mL via INTRAVENOUS
  Filled 2014-09-25: qty 10

## 2014-09-25 NOTE — Telephone Encounter (Signed)
Called patient and let her know results of scan and labs were good. She would her port to be removed and wants to use dr Tamala Julian and she can't use vascular because they are out of network. I called dr Tamala Julian and got appt 6/7 8 am and pt will be there and papers faxed

## 2014-09-26 LAB — CEA (SERIAL MONITOR): CEA: 0.8 ng/mL (ref 0.0–4.7)

## 2014-10-01 ENCOUNTER — Ambulatory Visit: Payer: Self-pay | Admitting: Internal Medicine

## 2014-10-01 ENCOUNTER — Inpatient Hospital Stay (HOSPITAL_BASED_OUTPATIENT_CLINIC_OR_DEPARTMENT_OTHER): Payer: No Typology Code available for payment source | Admitting: Internal Medicine

## 2014-10-01 VITALS — BP 120/76 | HR 65 | Temp 97.4°F | Resp 18 | Ht 67.0 in | Wt 151.2 lb

## 2014-10-01 DIAGNOSIS — Z85038 Personal history of other malignant neoplasm of large intestine: Secondary | ICD-10-CM

## 2014-10-01 DIAGNOSIS — D509 Iron deficiency anemia, unspecified: Secondary | ICD-10-CM | POA: Diagnosis not present

## 2014-10-01 DIAGNOSIS — Z90710 Acquired absence of both cervix and uterus: Secondary | ICD-10-CM | POA: Diagnosis not present

## 2014-10-01 DIAGNOSIS — C189 Malignant neoplasm of colon, unspecified: Secondary | ICD-10-CM

## 2014-10-01 DIAGNOSIS — Z9221 Personal history of antineoplastic chemotherapy: Secondary | ICD-10-CM | POA: Diagnosis not present

## 2014-10-01 DIAGNOSIS — Z803 Family history of malignant neoplasm of breast: Secondary | ICD-10-CM

## 2014-10-01 DIAGNOSIS — Z79899 Other long term (current) drug therapy: Secondary | ICD-10-CM

## 2014-10-09 ENCOUNTER — Ambulatory Visit
Admission: RE | Admit: 2014-10-09 | Discharge: 2014-10-09 | Disposition: A | Discharge status: Home / Self Care | Payer: No Typology Code available for payment source | Source: Ambulatory Visit | Attending: Obstetrics and Gynecology | Admitting: Obstetrics and Gynecology

## 2014-10-09 DIAGNOSIS — M810 Age-related osteoporosis without current pathological fracture: Secondary | ICD-10-CM | POA: Insufficient documentation

## 2014-10-09 DIAGNOSIS — Z13820 Encounter for screening for osteoporosis: Secondary | ICD-10-CM | POA: Insufficient documentation

## 2014-10-09 DIAGNOSIS — Z1231 Encounter for screening mammogram for malignant neoplasm of breast: Secondary | ICD-10-CM | POA: Insufficient documentation

## 2014-10-09 HISTORY — DX: Malignant neoplasm of colon, unspecified: C18.9

## 2014-10-20 ENCOUNTER — Encounter: Payer: Self-pay | Admitting: Internal Medicine

## 2014-10-20 DIAGNOSIS — C189 Malignant neoplasm of colon, unspecified: Secondary | ICD-10-CM | POA: Insufficient documentation

## 2014-10-20 NOTE — Progress Notes (Signed)
[place]  [phone] [phone]     ID: TAMANIKA HEINEY OB: 05/13/1965  MR#: [id number]  LOV#:[id number]  Patient Care Team: [place] Pink, MD as PCP - General (Family Medicine)  CHIEF COMPLAINT/DIAGNOSIS:  1. 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 5/26 lymph nodes positive for metastatic adenocarcinoma with focal extranodal extension  (initially diagnosed by colonoscopy and biopsy on 09/13/2013). 09/27/2013 CEA 0.7. CT scan of the C/A/P reportedly with no evidence of metastatic disease, large soft tissue mass seen in the mid pelvis. Started adjuvant chemo with FOLFOX regimen on 11/28/13, got cycle 4 on 01/09/14, then patient discontinued chemotherapy.  2. Iron deficiency Anemia  - unable to tolerate oral iron. Got IV Venofer May 2015.    HISTORY OF PRESENT ILLNESS:  Patient here for continued oncology followup for colon cancer as described above, she was seen few months ago. She was on chemotherapy up until September 16 and discontinued it after 4 cycles on her own despite explaining the benefits of chemotherapy and the risks of avoiding completing it. States that she is doing well. No recurrent abdominal or pelvic pain. Appetite is good. Denies any new cough, dyspnea, chest pain, or hemoptysis. No major pelvic pain, constipation, diarrhea, blood in stools or melena. No other bleeding symptoms. Physically active. No new bone pains, 0/10.   REVIEW OF SYSTEMS:   ROS As in HPI above. In addition, no fever, chills or sweats. No new headaches or focal weakness.  No new mood disturbances. No  sore throat, cough, shortness of breath, sputum, hemoptysis or chest pain. No dizziness or palpitation. No abdominal pain, constipation, diarrhea, dysuria or hematuria. No new skin rash or bleeding symptoms. No new paresthesias in extremities. PS ECOG 0.  PAST MEDICAL HISTORY:  Reviewed. Past Medical History  Diagnosis Date  . Cancer     colon cancer  . Colon cancer 2015    chemo  . Colon cancer 10/20/2014          Anemia.  Partial hysterectomy (has ovaries left behind).  C-section x2.             Colon cancer as above  PAST SURGICAL HISTORY: Reviewed. Past Surgical History  Procedure Laterality Date  . Colon surgery    . Abdominal hysterectomy    . Cesarean section    . Colonoscopy with propofol N/A 09/05/2014    Procedure: COLONOSCOPY WITH PROPOFOL;  Surgeon: Josefine Class, MD;  Location[facility] ENDOSCOPY;  Service: Endoscopy;  Laterality: N/A;  . Augmentation mammaplasty Bilateral 2004    FAMILY HISTORY: Reviewed. One aunt with breast cancer in her 90s. Denies other malignancies or any hematological disorders.  ADVANCED DIRECTIVES:  No - patient declined information  SOCIAL HISTORY: Reviewed. History  Substance Use Topics  . Smoking status: Never Smoker   . Smokeless tobacco: Not on file  . Alcohol Use: Not on file   Denies smoking or recreational drug usage. She takes at least 2 glasses of wine daily, denies intake of hard liquor.   No Known Allergies  Current Outpatient Prescriptions  Medication Sig Dispense Refill  . Ascorbic Acid (VITAMIN C PO) Take 1 tablet by mouth daily.    Marland Kitchen b complex vitamins tablet Take 1 tablet by mouth daily.    Marland Kitchen BIOTIN PO Take 1 Dose by mouth daily.    Marland Kitchen buPROPion (WELLBUTRIN XL) 150 MG 24 hr tablet Take 150 mg by  mouth daily.    . cholecalciferol (VITAMIN D) 1000 UNITS tablet Take 1,000 Units by mouth daily.    Marland Kitchen CO-ENZYME Q10 PO Take 1 Dose by mouth daily.    Marland Kitchen estradiol (CLIMARA - DOSED IN MG/24 HR) 0.075 mg/24hr patch Place 0.075 mg onto the skin once a week.    . NON FORMULARY Take 1 Dose by mouth daily. Pregnenolone    . polyethylene glycol powder (GLYCOLAX/MIRALAX) powder Take 1 Container by mouth once.    . Probiotic Product (PROBIOTIC DAILY PO) Take 1 tablet by mouth daily.    . progesterone  (PROMETRIUM) 100 MG capsule Take 100 mg by mouth daily.    . SELENIUM PO Take 1 Dose by mouth daily.    . TURMERIC PO Take 1 Dose by mouth daily.    Marland Kitchen VITAMIN K, PHYTONADIONE, PO Take 1 tablet by mouth daily.      No current facility-administered medications for this visit.   Facility-Administered Medications Ordered in Other Visits  Medication Dose Route Frequency Provider Last Rate Last Dose  . sodium chloride 0.9 % injection 10 mL  10 mL Intravenous PRN Leia Alf, MD   10 mL at 09/25/14 1206  . sodium chloride 0.9 % injection 10 mL  10 mL Intravenous PRN Leia Alf, MD   10 mL at 09/25/14 1206    PHYSICAL EXAM: Filed Vitals:   10/01/14 1624  BP: 120/76  Pulse: 65  Temp: 97.4 F (36.3 C)  Resp: 18     Body mass index is 23.68 kg/(m^2).    ECOG FS:0 - Asymptomatic  GENERAL: Patient is alert and oriented and in no acute distress. There is no icterus. HEENT: EOMs intact. Oral exam negative for thrush or lesions. No cervical lymphadenopathy. CVS: S1S2, regular LUNGS: Bilaterally clear to auscultation, no rhonchi. ABDOMEN: Soft, nontender. No hepatosplenomegaly clinically.  NEURO: grossly nonfocal, cranial nerves are intact.   EXTREMITIES: No pedal edema.    LAB RESULTS: 09/25/14 - CEA is 0.8.     Component Value Date/Time   NA 141 01/23/2014 0923   K 3.9 01/23/2014 0923   CL 104 01/23/2014 0923   CO2 26 01/23/2014 0923   GLUCOSE 92 01/23/2014 0923   BUN 10 01/23/2014 0923   CREATININE 0.55 09/25/2014 1153   CREATININE 0.66 05/29/2014 1005   CALCIUM 9.0 01/23/2014 0923   PROT 7.6 09/25/2014 1153   PROT 7.2 05/29/2014 1005   ALBUMIN 4.4 09/25/2014 1153   ALBUMIN 3.8 05/29/2014 1005   AST 22 09/25/2014 1153   AST 15 05/29/2014 1005   ALT 17 09/25/2014 1153   ALT 23 05/29/2014 1005   ALKPHOS 60 09/25/2014 1153   ALKPHOS 61 05/29/2014 1005   BILITOT 0.6 09/25/2014 1153   GFRNONAA >60 09/25/2014 1153   GFRNONAA >60 01/09/2014 0911   GFRAA >60 09/25/2014 1153    GFRAA >60 01/09/2014 0911   Lab Results  Component Value Date   WBC 7.0 09/25/2014   NEUTROABS 4.7 09/25/2014   HGB 12.4 09/25/2014   HCT 37.7 09/25/2014   MCV 93.9 09/25/2014   PLT 197 09/25/2014     STUDIES: Ct Abdomen Pelvis W Contrast  09/25/2014   CLINICAL DATA:  Subsequent encounter for colon cancer status post partial colonic resection.  EXAM: CT ABDOMEN AND PELVIS WITH CONTRAST  TECHNIQUE: Multidetector CT imaging of the abdomen and pelvis was performed using the standard protocol following bolus administration of intravenous contrast.  CONTRAST:  124mL OMNIPAQUE IOHEXOL 300 MG/ML  SOLN  COMPARISON:  10/16/2013.  FINDINGS: Lower chest:  Unremarkable.  Hepatobiliary: No focal abnormality within the liver parenchyma. There is no evidence for gallstones, gallbladder wall thickening, or pericholecystic fluid. No intrahepatic or extrahepatic biliary dilation.  Pancreas: No focal mass lesion. No dilatation of the main duct. No intraparenchymal cyst. No peripancreatic edema.  Spleen: No splenomegaly. No focal mass lesion.  Adrenals/Urinary Tract: No adrenal nodule or mass. Solitary left kidney has normal CT imaging features. No hydroureter. Urinary bladder is normal in appearance.  Stomach/Bowel: Stomach is nondistended. No gastric wall thickening. No evidence of outlet obstruction. Duodenum is normally positioned as is the ligament of Treitz. No small bowel wall thickening. No small bowel dilatation. Patient is status post cecectomy. Colon otherwise unremarkable.  Vascular/Lymphatic: No abdominal aortic aneurysm. Persistent left-sided IVC noted, normal variant. There is no gastrohepatic or hepatoduodenal ligament lymphadenopathy. No retroperitoneal or extraperitoneal lymphadenopathy.  Reproductive: Uterus unremarkable.  No evidence for adnexal mass.  Other: No intraperitoneal free fluid.  Musculoskeletal: Fluid collection in the left adductor region unchanged since 2008, related to ganglion cyst  from the left hip. Bone windows reveal no worrisome lytic or sclerotic osseous lesions.  IMPRESSION: Unremarkable CT evaluation of the abdomen and pelvis. Specifically, no findings to suggest recurrent or metastatic disease.   Electronically Signed   By: Misty Stanley M.D.   On: 09/25/2014 14:05   Dg Bone Density  10/09/2014   EXAM: DUAL X-RAY ABSORPTIOMETRY (DXA) FOR BONE MINERAL DENSITY  IMPRESSION: Dear Dr. Georga Bora,  Your patient Shekela Goodridge completed a BMD test on 10/09/2014 using the [place] (analysis version: 14.10) manufactured by [employer]. The following summarizes the results of our evaluation.  PATIENT BIOGRAPHICAL: Name: Meekah, Math Patient ID: [id number] Birth Date: 1966/04/16 Height: 67.0 in. Gender: Female Exam Date: 10/09/2014 Weight: 150.0 lbs. Indications: Caucasian, Hysterectomy, Colon ca Fractures: Treatments: Vitamin D  ASSESSMENT: The BMD measured at Femur Neck Right is 0.696 g/cm2 with a T-score of -2.5. This patient is considered osteoporotic according to [facility]) criteria.  Site Region Measured Measured WHO Young Adult BMD Date       Age      Classification T-score AP Spine L1-L4 10/09/2014 51.0 Normal 0.4 1.248 g/cm2  DualFemur Neck Right 10/09/2014 51.0 Osteoporosis -2.5 0.696 g/cm2  World Health Organization [facility]) criteria for post-menopausal, Caucasian Women: Normal:       T-score at or above -1 SD Osteopenia:   T-score between -1 and -2.5 SD Osteoporosis: T-score at or below -2.5 SD  RECOMMENDATIONS: [place] recommends that FDA-approved medical therapies be considered in postmenopausal women and men age 44 or older with a: 1. Hip or vertebral (clinical or morphometric) fracture. 2. T-score of < -2.5 at the spine or hip. 3. Ten-year fracture probability by FRAX of 3% or greater for hip fracture or 20% or greater for major osteoporotic fracture.  All treatment decisions require clinical judgment and  consideration of individual patient factors, including patient preferences, co-morbidities, previous drug use, risk factors not captured in the FRAX model (e.g. falls, vitamin D deficiency, increased bone turnover, interval significant decline in bone density) and possible under - or over-estimation of fracture risk by FRAX.  All patients should ensure an adequate intake of dietary calcium (1200 mg/d) and vitamin D (800 IU daily) unless contraindicated.  FOLLOW-UP: People with diagnosed cases of osteoporosis or at high risk for fracture should have regular bone mineral density tests. For patients eligible for Medicare, routine testing is allowed once every  2 years. The testing frequency can be increased to one year for patients who have rapidly progressing disease, those who are receiving or discontinuing medical therapy to restore bone mass, or have additional risk factors.  I have reviewed this report, and agree with the above findings.  [facility] Radiology   Electronically Signed   By: Marijo Conception, M.D.   On: 10/09/2014 15:03   Mm Digital Screening Bilateral  10/10/2014   CLINICAL DATA:  Screening.  EXAM: DIGITAL SCREENING BILATERAL MAMMOGRAM WITH CAD with implants.  The patient has bilateral submuscular saline implants.  COMPARISON:  Previous exam(s).  ACR Breast Density Category d: The breast tissue is extremely dense, which lowers the sensitivity of mammography.  FINDINGS: There are no findings suspicious for malignancy. Images were processed with CAD. Right-sided Port-A-Cath in place.  IMPRESSION: No mammographic evidence of malignancy. A result letter of this screening mammogram will be mailed directly to the patient.  RECOMMENDATION: Screening mammogram in one year. (Code:SM-B-01Y)  BI-RADS CATEGORY  1: Negative.   Electronically Signed   By: Conchita Paris M.D.   On: 10/10/2014 08:14    STAGING: Colon cancer   Staging form: Colon and Rectum, AJCC 7th Edition     Clinical stage from 10/20/2014:  Stage IIIB (T3, N2a, M0) - Signed by Leia Alf, MD on 10/20/2014   ASSESSMENT / PLAN:   1. 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 5/26 lymph nodes positive for metastatic adenocarcinoma with focal extranodal extension (initially diagnosed by colonoscopy and biopsy on 09/13/2013). 09/27/2013 CEA 0.7. CT scan of the C/A/P reportedly with no evidence of metastatic disease, large soft tissue mass seen in the mid pelvis -  Labs from 6/1 were reviewed and explained to patient in detail. Also independently reviewed recent CT scan and d/w patient. Serum CEA remains normal at 0.8. Clinically she does not have any new symptoms to suggest recurrent or metastatic colon cancer. Patient again was explained about the benefit of adjuvant chemotherapy which she discontinued in September after 4 cycles only, patient states that she understands details explained and does not want to pursue chemotherapy. She understands the risks involved including recurrence of malignancy and metastasis with possible subsequent complications. Patient is agreeable for continued surveillance. Will see her back at 24 weeks with repeat CBC, LFT, creatinine, CEA. CT scan will be annual surveillance or earlier if she has new symptoms or rising CEA level.   2. Port-A-Cath - wants removed.  3. Iron deficiency Anemia  - unable to tolerate oral iron. Status post parenteral iron therapy. Hemoglobin doing well. Continue to monitor.  4. In between visits, she was advised to call in case of any new symptoms, unintentional weight loss, or acute sickness and will be evaluated sooner. She is agreeable to this plan.     Leia Alf, MD   10/20/2014 1:43 PM

## 2015-03-10 ENCOUNTER — Inpatient Hospital Stay: Payer: No Typology Code available for payment source

## 2015-03-10 ENCOUNTER — Other Ambulatory Visit: Payer: Self-pay | Admitting: *Deleted

## 2015-03-10 ENCOUNTER — Inpatient Hospital Stay: Payer: No Typology Code available for payment source | Admitting: Internal Medicine

## 2015-03-10 ENCOUNTER — Telehealth: Payer: Self-pay | Admitting: *Deleted

## 2015-03-10 DIAGNOSIS — C189 Malignant neoplasm of colon, unspecified: Secondary | ICD-10-CM

## 2015-03-10 NOTE — Telephone Encounter (Signed)
Pt called today and said she has on her phone that she has appt tom. 11/15 at 11:15 and she has it with pandit and she knows he has left and wants to confirm this is still the date of appt.  I checked and pt actually had appt on today and she missed it and it has been r/s to 12/5 and she is agreeable to the new appt date and it has been mailed to her.  She will see Dr. Jacinto Reap

## 2015-03-28 ENCOUNTER — Encounter: Payer: Self-pay | Admitting: *Deleted

## 2015-03-31 ENCOUNTER — Encounter: Payer: Self-pay | Admitting: Internal Medicine

## 2015-03-31 ENCOUNTER — Inpatient Hospital Stay: Payer: No Typology Code available for payment source | Attending: Internal Medicine

## 2015-03-31 ENCOUNTER — Inpatient Hospital Stay (HOSPITAL_BASED_OUTPATIENT_CLINIC_OR_DEPARTMENT_OTHER): Payer: No Typology Code available for payment source | Admitting: Internal Medicine

## 2015-03-31 VITALS — BP 106/72 | HR 72 | Temp 97.1°F | Wt 153.8 lb

## 2015-03-31 DIAGNOSIS — Z9049 Acquired absence of other specified parts of digestive tract: Secondary | ICD-10-CM

## 2015-03-31 DIAGNOSIS — Z85038 Personal history of other malignant neoplasm of large intestine: Secondary | ICD-10-CM | POA: Insufficient documentation

## 2015-03-31 DIAGNOSIS — C189 Malignant neoplasm of colon, unspecified: Secondary | ICD-10-CM

## 2015-03-31 DIAGNOSIS — Z79899 Other long term (current) drug therapy: Secondary | ICD-10-CM | POA: Diagnosis not present

## 2015-03-31 DIAGNOSIS — C182 Malignant neoplasm of ascending colon: Secondary | ICD-10-CM | POA: Diagnosis not present

## 2015-03-31 LAB — CREATININE, SERUM
CREATININE: 0.78 mg/dL (ref 0.44–1.00)
GFR calc Af Amer: >60 mL/min (ref >60)
GFR calc non Af Amer: >60 mL/min (ref >60)

## 2015-03-31 LAB — CBC WITH DIFFERENTIAL/PLATELET
Basophils Absolute: 0 10*3/uL (ref 0–0.1)
Basophils Relative: 1 %
EOS ABS: 0.1 10*3/uL (ref 0–0.7)
EOS PCT: 2 %
HCT: 39.9 % (ref 35.0–47.0)
Hemoglobin: 13.6 g/dL (ref 12.0–16.0)
LYMPHS ABS: 1.8 10*3/uL (ref 1.0–3.6)
Lymphocytes Relative: 30 %
MCH: 31.7 pg (ref 26.0–34.0)
MCHC: 34.1 g/dL (ref 32.0–36.0)
MCV: 93 fL (ref 80.0–100.0)
Monocytes Absolute: 0.3 10*3/uL (ref 0.2–0.9)
Monocytes Relative: 5 %
NEUTROS PCT: 62 %
Neutro Abs: 3.6 10*3/uL (ref 1.4–6.5)
Platelets: 235 10*3/uL (ref 150–440)
RBC: 4.29 MIL/uL (ref 3.80–5.20)
RDW: 12.8 % (ref 11.5–14.5)
WBC: 5.8 10*3/uL (ref 3.6–11.0)

## 2015-03-31 LAB — HEPATIC FUNCTION PANEL
ALT: 15 U/L (ref 14–54)
AST: 21 U/L (ref 15–41)
Albumin: 4.4 g/dL (ref 3.5–5.0)
Alkaline Phosphatase: 50 U/L (ref 38–126)
BILIRUBIN TOTAL: 0.9 mg/dL (ref 0.3–1.2)
Bilirubin, Direct: 0.2 mg/dL (ref 0.1–0.5)
Indirect Bilirubin: 0.7 mg/dL (ref 0.3–0.9)
Total Protein: 7.7 g/dL (ref 6.5–8.1)

## 2015-03-31 NOTE — Progress Notes (Signed)
Her port a cath has been removed.

## 2015-03-31 NOTE — Progress Notes (Signed)
[place] OFFICE PROGRESS NOTE  Patient Care Team: [place] Pink, MD as PCP - General (Family Medicine)   SUMMARY OF ONCOLOGIC HISTORY: # June 2015- COLON CA [caecum] STAGE III [pT3pN2aM0] mod diff ca s/p hemi-colectomy [[facility]]; CEA- 0.7; CT- No distant mets; AUG 2015- STARTED FOLFOX x4 treatments; pt Discont; s/p colo [Dr.Rein May 2016]; June 2016- CT A/P- NED  # IDA s/p Venofer   INTERVAL HISTORY:  This is my first interaction with the patient since I joined the practice September 2016. I reviewed the patient's prior charts/pertinent labs/imaging in detail; findings are summarized above.   [name] with a history of right-sided colon cancer status post hemicolectomy status post 4 cycles of adjuvant FOLFOX chemotherapy in August 2015 is here for follow-up.  Patient has a good appetite. She denies losing any weight. Denies any nausea vomiting. Denies any blood in stools black stools. Patient is interested in alternative therapy/complementary therapies.   REVIEW OF SYSTEMS:  A complete 10 point review of system is done which is negative except mentioned above/history of present illness.   PAST MEDICAL HISTORY :  Past Medical History  Diagnosis Date  . Cancer [facility])     colon cancer  . Colon cancer ([place]) 2015    chemo  . Colon cancer ([place]) 10/20/2014    PAST SURGICAL HISTORY :   Past Surgical History  Procedure Laterality Date  . Colon surgery    . Abdominal hysterectomy    . Cesarean section    . Colonoscopy with propofol N/A 09/05/2014    Procedure: COLONOSCOPY WITH PROPOFOL;  Surgeon: Josefine Class, MD;  Location: [facility] ENDOSCOPY;  Service: Endoscopy;  Laterality: N/A;  . Augmentation mammaplasty Bilateral 2004  . Port-a-cath removal  2016    FAMILY HISTORY :  No family history on file.  SOCIAL HISTORY:   Social History  Substance Use Topics  . Smoking status: Never Smoker   . Smokeless tobacco: Never Used  .  Alcohol Use: No    ALLERGIES:  has No Known Allergies.  MEDICATIONS:  Current Outpatient Prescriptions  Medication Sig Dispense Refill  . Ascorbic Acid (VITAMIN C PO) Take 1 tablet by mouth daily.    Marland Kitchen b complex vitamins tablet Take 1 tablet by mouth daily.    Marland Kitchen BIOTIN PO Take 1 Dose by mouth daily.    . cholecalciferol (VITAMIN D) 1000 UNITS tablet Take 1,000 Units by mouth daily.    Marland Kitchen CO-ENZYME Q10 PO Take 1 Dose by mouth daily.    Marland Kitchen estradiol (CLIMARA - DOSED IN MG/24 HR) 0.075 mg/24hr patch Place 0.075 mg onto the skin once a week.     . NON FORMULARY Take 1 Dose by mouth daily. Pregnenolone    . Probiotic Product (PROBIOTIC DAILY PO) Take 1 tablet by mouth daily.    . progesterone (PROMETRIUM) 100 MG capsule Take 100 mg by mouth daily.     . SELENIUM PO Take 1 Dose by mouth daily.    . TURMERIC PO Take 1 Dose by mouth daily.    Marland Kitchen VITAMIN K, PHYTONADIONE, PO Take 1 tablet by mouth daily.     Marland Kitchen buPROPion (WELLBUTRIN XL) 150 MG 24 hr tablet Take 150 mg by mouth daily.    . polyethylene glycol powder (GLYCOLAX/MIRALAX) powder Take 1 Container by mouth once.     No current facility-administered medications for this visit.   Facility-Administered Medications Ordered in Other Visits  Medication Dose Route Frequency Provider Last  Rate Last Dose  . sodium chloride 0.9 % injection 10 mL  10 mL Intravenous PRN Leia Alf, MD   10 mL at 09/25/14 1206  . sodium chloride 0.9 % injection 10 mL  10 mL Intravenous PRN Leia Alf, MD   10 mL at 09/25/14 1206    PHYSICAL EXAMINATION: ECOG PERFORMANCE STATUS: 0 - Asymptomatic  BP 106/72 mmHg  Pulse 72  Temp(Src) 97.1 F (36.2 C) (Tympanic)  Wt 153 lb 12.3 oz (69.75 kg)  Filed Weights   03/31/15 0923  Weight: 153 lb 12.3 oz (69.75 kg)    GENERAL: Well-nourished well-developed; Alert, no distress and comfortable.   Alone.  EYES: no pallor or icterus OROPHARYNX: no thrush or ulceration; good dentition  NECK: supple, no masses  felt LYMPH:  no palpable lymphadenopathy in the cervical, axillary or inguinal regions LUNGS: clear to auscultation and  No wheeze or crackles HEART/CVS: regular rate & rhythm and no murmurs; No lower extremity edema ABDOMEN:abdomen soft, non-tender and normal bowel sounds Musculoskeletal:no cyanosis of digits and no clubbing  PSYCH: alert & oriented x 3 with fluent speech NEURO: no focal motor/sensory deficits SKIN:  no rashes or significant lesions  LABORATORY DATA:  I have reviewed the data as listed    Component Value Date/Time   NA 141 01/23/2014 0923   K 3.9 01/23/2014 0923   CL 104 01/23/2014 0923   CO2 26 01/23/2014 0923   GLUCOSE 92 01/23/2014 0923   BUN 10 01/23/2014 0923   CREATININE 0.78 03/31/2015 0909   CREATININE 0.66 05/29/2014 1005   CALCIUM 9.0 01/23/2014 0923   PROT 7.7 03/31/2015 0909   PROT 7.2 05/29/2014 1005   ALBUMIN 4.4 03/31/2015 0909   ALBUMIN 3.8 05/29/2014 1005   AST 21 03/31/2015 0909   AST 15 05/29/2014 1005   ALT 15 03/31/2015 0909   ALT 23 05/29/2014 1005   ALKPHOS 50 03/31/2015 0909   ALKPHOS 61 05/29/2014 1005   BILITOT 0.9 03/31/2015 0909   BILITOT 0.5 05/29/2014 1005   GFRNONAA >60 03/31/2015 0909   GFRNONAA >60 05/29/2014 1005   GFRNONAA >60 01/09/2014 0911   GFRAA >60 03/31/2015 0909   GFRAA >60 05/29/2014 1005   GFRAA >60 01/09/2014 0911    No results found for: SPEP, UPEP  Lab Results  Component Value Date   WBC 5.8 03/31/2015   NEUTROABS 3.6 03/31/2015   HGB 13.6 03/31/2015   HCT 39.9 03/31/2015   MCV 93.0 03/31/2015   PLT 235 03/31/2015      Chemistry      Component Value Date/Time   NA 141 01/23/2014 0923   K 3.9 01/23/2014 0923   CL 104 01/23/2014 0923   CO2 26 01/23/2014 0923   BUN 10 01/23/2014 0923   CREATININE 0.78 03/31/2015 0909   CREATININE 0.66 05/29/2014 1005      Component Value Date/Time   CALCIUM 9.0 01/23/2014 0923   ALKPHOS 50 03/31/2015 0909   ALKPHOS 61 05/29/2014 1005   AST 21  03/31/2015 0909   AST 15 05/29/2014 1005   ALT 15 03/31/2015 0909   ALT 23 05/29/2014 1005   BILITOT 0.9 03/31/2015 0909   BILITOT 0.5 05/29/2014 1005       RADIOGRAPHIC STUDIES: I have personally reviewed the radiological images as listed and agreed with the findings in the report. No results found.   ASSESSMENT & PLAN:   # Colon cancer stage III- status post FOLFOX 4 of the planned 12 treatments. Discontinued because of patient's  preference/side effects. Clinically no evidence of recurrence noted this time.   # I would recommend CAT scans for surveillance at least for the 3 years on a semiannual basis; and then every year for the total of 5 years. I reviewed at length the rationale for surveillance-scans/colonoscopies. She agrees to have a CT scan in the next 3 months.  # I reviewed the blood work from today within normal limits.   # Discussed with the patient that there is no scientific basis for alternative/complementary therapies for the treatment and prevention of colon cancer at this time. However I do agree eating healthy [more greens; less red meat] and exercise/optimal body mass- shown improved survival.  All questions were answered. The patient knows to call the clinic with any problems, questions or concerns. No barriers to learning was detected.  # 25 minutes face-to-face with the patient discussing the above plan of care; more than 50% of time spent on prognosis/ natural history; counseling and coordination.       Cammie Sickle, MD 03/31/2015 9:51 AM

## 2015-04-01 LAB — CEA: CEA: 1 ng/mL (ref 0.0–4.7)

## 2015-06-30 ENCOUNTER — Ambulatory Visit
Admission: RE | Admit: 2015-06-30 | Discharge: 2015-06-30 | Disposition: A | Discharge status: Home / Self Care | Payer: BLUE CROSS/BLUE SHIELD | Source: Ambulatory Visit | Attending: Internal Medicine | Admitting: Internal Medicine

## 2015-06-30 ENCOUNTER — Inpatient Hospital Stay: Payer: BLUE CROSS/BLUE SHIELD

## 2015-06-30 DIAGNOSIS — C182 Malignant neoplasm of ascending colon: Secondary | ICD-10-CM | POA: Diagnosis not present

## 2015-06-30 DIAGNOSIS — Z79899 Other long term (current) drug therapy: Secondary | ICD-10-CM | POA: Insufficient documentation

## 2015-06-30 DIAGNOSIS — Z9049 Acquired absence of other specified parts of digestive tract: Secondary | ICD-10-CM | POA: Diagnosis not present

## 2015-06-30 DIAGNOSIS — Z9221 Personal history of antineoplastic chemotherapy: Secondary | ICD-10-CM | POA: Diagnosis not present

## 2015-06-30 LAB — CREATININE, SERUM
Creatinine, Ser: 0.61 mg/dL (ref 0.44–1.00)
GFR calc Af Amer: >60 mL/min (ref >60)
GFR calc non Af Amer: >60 mL/min (ref >60)

## 2015-06-30 LAB — HEMOGLOBIN: HEMOGLOBIN: 13 g/dL (ref 12.0–16.0)

## 2015-06-30 LAB — HEPATIC FUNCTION PANEL
ALK PHOS: 57 U/L (ref 38–126)
ALT: 14 U/L (ref 14–54)
AST: 18 U/L (ref 15–41)
Albumin: 4.3 g/dL (ref 3.5–5.0)
BILIRUBIN TOTAL: 0.5 mg/dL (ref 0.3–1.2)
TOTAL PROTEIN: 7.9 g/dL (ref 6.5–8.1)

## 2015-06-30 LAB — HEMATOCRIT: HEMATOCRIT: 38.8 % (ref 35.0–47.0)

## 2015-06-30 MED ORDER — IOHEXOL 300 MG/ML  SOLN
75.0000 mL | Freq: Once | INTRAMUSCULAR | Status: AC | PRN
  Administered 2015-06-30: 75 mL via INTRAVENOUS

## 2015-07-01 LAB — CEA: CEA: 1.2 ng/mL (ref 0.0–4.7)

## 2015-07-02 ENCOUNTER — Ambulatory Visit: Payer: No Typology Code available for payment source | Admitting: Internal Medicine

## 2015-07-02 ENCOUNTER — Encounter: Payer: Self-pay | Admitting: Internal Medicine

## 2015-07-02 ENCOUNTER — Inpatient Hospital Stay: Payer: BLUE CROSS/BLUE SHIELD | Attending: Internal Medicine | Admitting: Internal Medicine

## 2015-07-02 VITALS — BP 130/71 | HR 84 | Temp 97.8°F | Resp 18 | Ht 67.0 in | Wt 158.3 lb

## 2015-07-02 DIAGNOSIS — C182 Malignant neoplasm of ascending colon: Secondary | ICD-10-CM | POA: Diagnosis not present

## 2015-07-02 DIAGNOSIS — Z79899 Other long term (current) drug therapy: Secondary | ICD-10-CM

## 2015-07-02 DIAGNOSIS — Z9221 Personal history of antineoplastic chemotherapy: Secondary | ICD-10-CM | POA: Diagnosis not present

## 2015-07-02 NOTE — Progress Notes (Signed)
[place] OFFICE PROGRESS NOTE  Patient Care Team: [place] Pink, MD as PCP - General (Family Medicine)   SUMMARY OF ONCOLOGIC HISTORY: # June 2015- COLON CA [caecum] STAGE III [pT3pN2aM0] mod diff ca s/p hemi-colectomy [[facility]]; CEA- 0.7; CT- No distant mets; AUG 2015- STARTED FOLFOX x4 treatments; pt Discont; s/p colo [Dr.Rein May 2016; repeat colo- in May 2018]; FEB 2017CT A/P- NED  # IDA s/p Venofer   INTERVAL HISTORY:  A very pleasant 50 year old female patient with a history of right-sided colon cancer status post hemicolectomy status post 4 cycles of adjuvant FOLFOX chemotherapy in August 2015 is here for follow-up/review the results of CT scan.   Overall patient feels good. Denies any unusual aches and pains. She is chronic joint pains. Not any worse.  Patient has a good appetite. She denies losing any weight. Denies any nausea vomiting. Denies any blood in stools black stools.   REVIEW OF SYSTEMS:  A complete 10 point review of system is done which is negative except mentioned above/history of present illness.   PAST MEDICAL HISTORY :  Past Medical History  Diagnosis Date  . Colon cancer ([place]) 2015    chemo  . Colon cancer ([place]) 10/20/2014    PAST SURGICAL HISTORY :   Past Surgical History  Procedure Laterality Date  . Colon surgery    . Abdominal hysterectomy    . Cesarean section    . Colonoscopy with propofol N/A 09/05/2014    Procedure: COLONOSCOPY WITH PROPOFOL;  Surgeon: Josefine Class, MD;  Location: [facility] ENDOSCOPY;  Service: Endoscopy;  Laterality: N/A;  . Augmentation mammaplasty Bilateral 2004  . Port-a-cath removal  2016    FAMILY HISTORY :  History reviewed. No pertinent family history.  SOCIAL HISTORY:   Social History  Substance Use Topics  . Smoking status: Never Smoker   . Smokeless tobacco: Never Used  . Alcohol Use: No    ALLERGIES:  has No Known Allergies.  MEDICATIONS:  No current outpatient prescriptions on file.    No current facility-administered medications for this visit.   Facility-Administered Medications Ordered in Other Visits  Medication Dose Route Frequency Provider Last Rate Last Dose  . sodium chloride 0.9 % injection 10 mL  10 mL Intravenous PRN Leia Alf, MD   10 mL at 09/25/14 1206  . sodium chloride 0.9 % injection 10 mL  10 mL Intravenous PRN Leia Alf, MD   10 mL at 09/25/14 1206    PHYSICAL EXAMINATION: ECOG PERFORMANCE STATUS: 0 - Asymptomatic  BP 130/71 mmHg  Pulse 84  Temp(Src) 97.8 F (36.6 C) (Tympanic)  Resp 18  Ht 5\' 7"  (1.702 m)  Wt 158 lb 4.6 oz (71.8 kg)  BMI 24.79 kg/m2  Filed Weights   07/02/15 1406  Weight: 158 lb 4.6 oz (71.8 kg)    GENERAL: Well-nourished well-developed; Alert, no distress and comfortable.   Alone.  EYES: no pallor or icterus OROPHARYNX: no thrush or ulceration; good dentition  NECK: supple, no masses felt LYMPH:  no palpable lymphadenopathy in the cervical, axillary or inguinal regions LUNGS: clear to auscultation and  No wheeze or crackles HEART/CVS: regular rate & rhythm and no murmurs; No lower extremity edema ABDOMEN:abdomen soft, non-tender and normal bowel sounds Musculoskeletal:no cyanosis of digits and no clubbing  PSYCH: alert & oriented x 3 with fluent speech NEURO: no focal motor/sensory deficits SKIN:  no rashes or significant lesions  LABORATORY DATA:  I have reviewed the data as listed  Component Value Date/Time   NA 141 01/23/2014 0923   K 3.9 01/23/2014 0923   CL 104 01/23/2014 0923   CO2 26 01/23/2014 0923   GLUCOSE 92 01/23/2014 0923   BUN 10 01/23/2014 0923   CREATININE 0.61 06/30/2015 1044   CREATININE 0.66 05/29/2014 1005   CALCIUM 9.0 01/23/2014 0923   PROT 7.9 06/30/2015 1044   PROT 7.2 05/29/2014 1005   ALBUMIN 4.3 06/30/2015 1044   ALBUMIN 3.8 05/29/2014 1005   AST 18 06/30/2015 1044   AST 15 05/29/2014 1005   ALT 14 06/30/2015 1044   ALT 23 05/29/2014 1005   ALKPHOS 57  06/30/2015 1044   ALKPHOS 61 05/29/2014 1005   BILITOT 0.5 06/30/2015 1044   BILITOT 0.5 05/29/2014 1005   GFRNONAA >60 06/30/2015 1044   GFRNONAA >60 05/29/2014 1005   GFRNONAA >60 01/09/2014 0911   GFRAA >60 06/30/2015 1044   GFRAA >60 05/29/2014 1005   GFRAA >60 01/09/2014 0911    No results found for: SPEP, UPEP  Lab Results  Component Value Date   WBC 5.8 03/31/2015   NEUTROABS 3.6 03/31/2015   HGB 13.0 06/30/2015   HCT 38.8 06/30/2015   MCV 93.0 03/31/2015   PLT 235 03/31/2015      Chemistry      Component Value Date/Time   NA 141 01/23/2014 0923   K 3.9 01/23/2014 0923   CL 104 01/23/2014 0923   CO2 26 01/23/2014 0923   BUN 10 01/23/2014 0923   CREATININE 0.61 06/30/2015 1044   CREATININE 0.66 05/29/2014 1005      Component Value Date/Time   CALCIUM 9.0 01/23/2014 0923   ALKPHOS 57 06/30/2015 1044   ALKPHOS 61 05/29/2014 1005   AST 18 06/30/2015 1044   AST 15 05/29/2014 1005   ALT 14 06/30/2015 1044   ALT 23 05/29/2014 1005   BILITOT 0.5 06/30/2015 1044   BILITOT 0.5 05/29/2014 1005       ASSESSMENT & PLAN:   # Colon cancer stage III- status post FOLFOX 4 of the planned 12 treatments. Discontinued because of patient's preference/side effects. Clinically no evidence of recurrence noted this time. FEB 2017- CT scan showed no evidence of recurrence.   CBC within normal limits.  # Colonoscopy- May 2016 2 mm polyp. Recommend surveillance.   #  I reviewed the importance of surveillance scans at least for 3 years;  Patient is reluctant.  She  Agrees to have a CT scan/ before the end of the year because of co-pay.  Patient will follow-up  With me sometime in November 20 17.  I reviewed the images for the scan myself.  # 25 minutes face-to-face with the patient discussing the above plan of care; more than 50% of time spent on prognosis/ natural history; counseling and coordination.      Cammie Sickle, MD 07/02/2015 2:33 PM

## 2015-07-02 NOTE — Progress Notes (Signed)
Pt here for f/u of colon cancer, BM are normal for her.  No abd. Pain.

## 2015-12-08 IMAGING — CT CT ANGIO CHEST
2 of 6 series · 18 of 36 positions shown · IV contrast (APPLIED)
Comparison: CT 09/17/2006.

ADDENDUM:
CTA of the chest:

Thoracic aorta unremarkable. No aneurysm or dissection. Heart size
normal. Pulmonary arteries are normal.
No significant adenopathy.  Thoracic esophagus nondistended.
Large airways are patent. Apical and basilar pleural parenchymal
thickening and most consistent with scarring and/or atelectasis.
Small right pleural effusion. No pneumothorax.
Bilateral breast implants. Thyroid region unremarkable. No
significant axillary lymph nodes. No significant bony abnormality.
CLINICAL DATA: Recent colon surgery. Chest pain. Prior partial
hysterectomy. Prior appendectomy.
EXAM:
CT ABDOMEN AND PELVIS WITH CONTRAST
TECHNIQUE: Multidetector CT imaging of the abdomen and pelvis was performed
using the standard protocol following bolus administration of
intravenous contrast.
CONTRAST:  100 cc Isovue 370.

[Series 7: pe 1.0 thins · axial · 0.68mm/px · z∈[-543,-271]mm · 17 of 306 slices shown]
[im 17/306  lung]
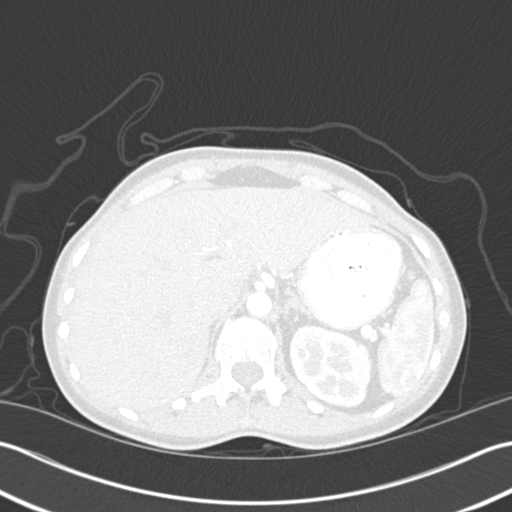
[im 34/306  mediastinal]
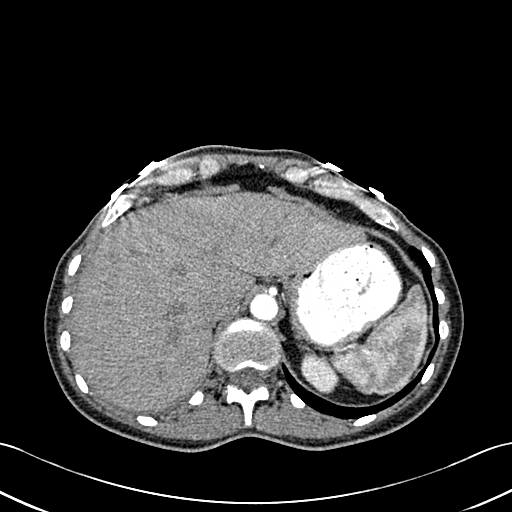
[im 51/306  lung]
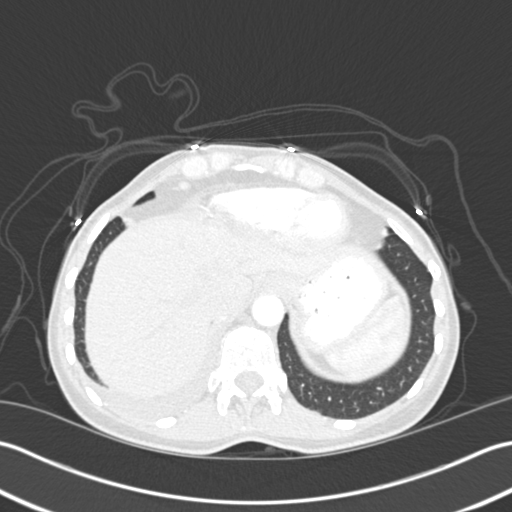
[im 68/306  mediastinal]
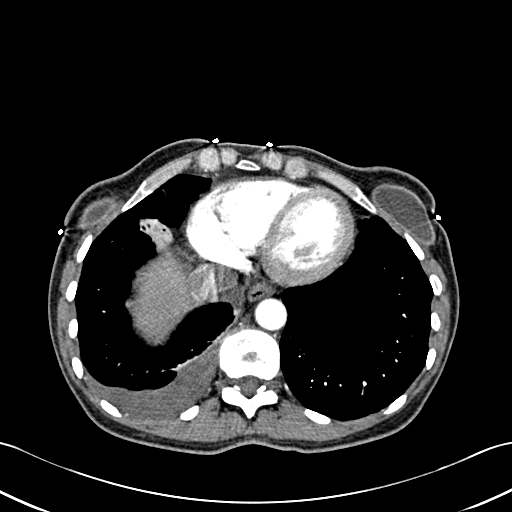
[im 85/306  lung]
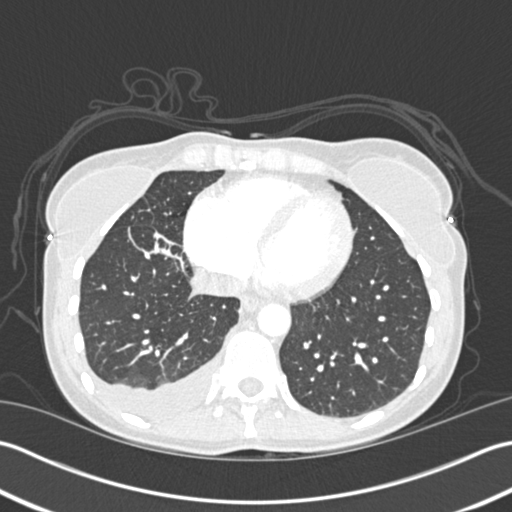
[im 102/306  mediastinal]
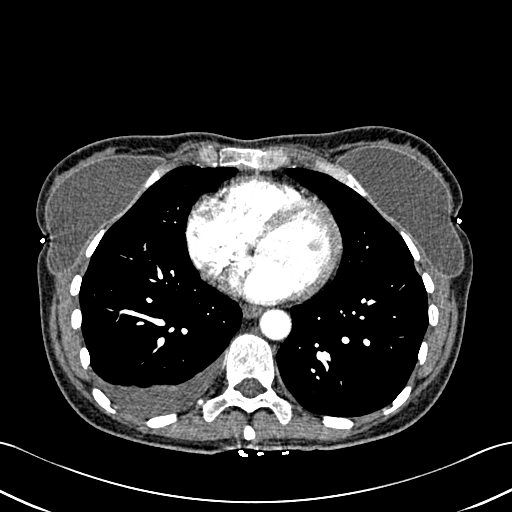
[im 119/306  lung]
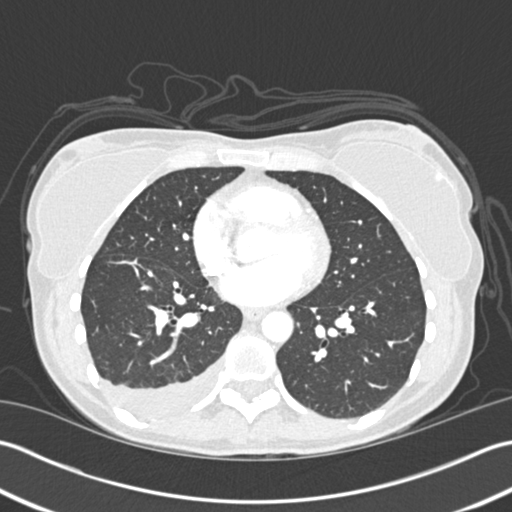
[im 136/306  mediastinal]
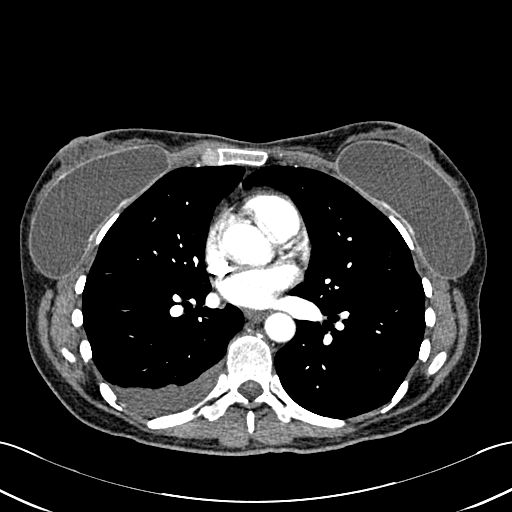
[im 153/306  lung]
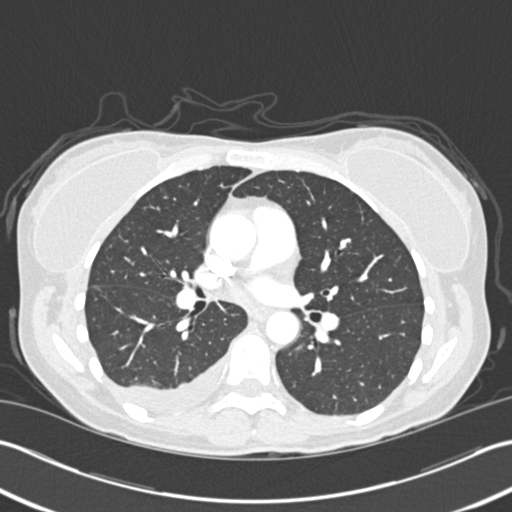
[im 170/306  mediastinal]
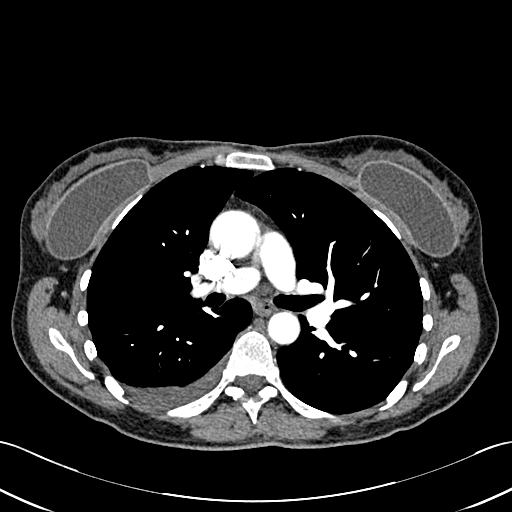
[im 187/306  lung]
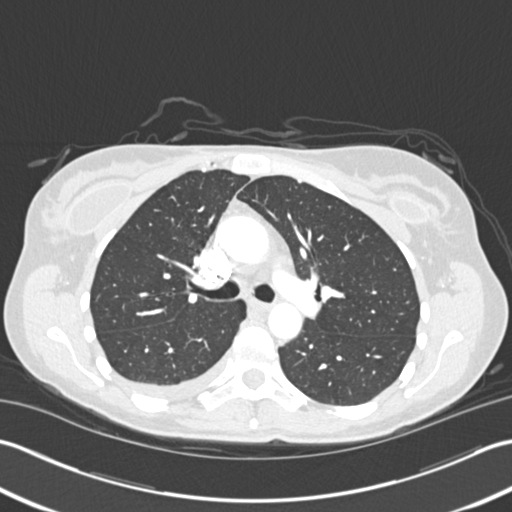
[im 204/306  mediastinal]
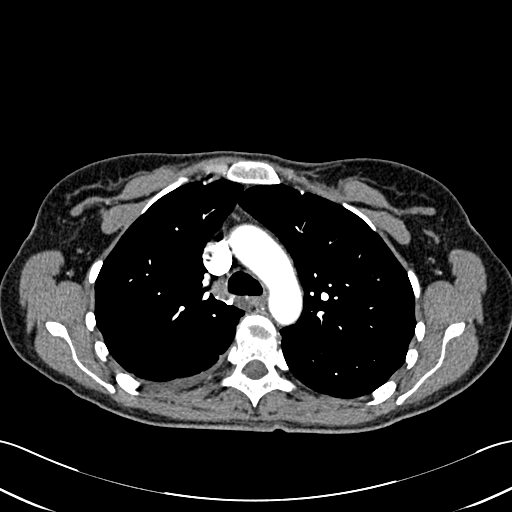
[im 221/306  lung]
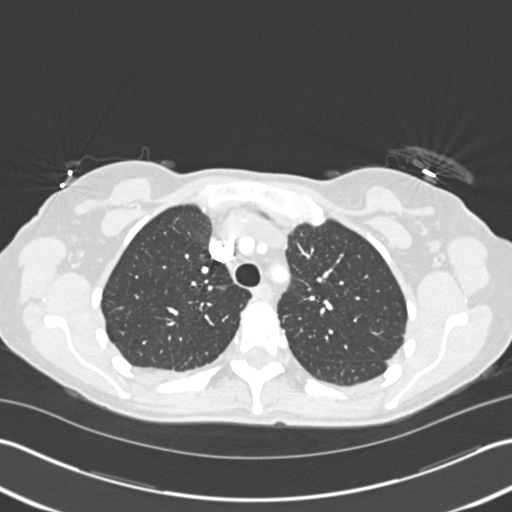
[im 238/306  mediastinal]
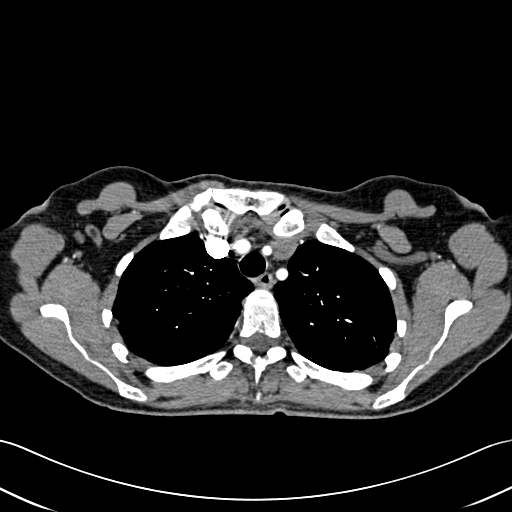
[im 255/306  lung]
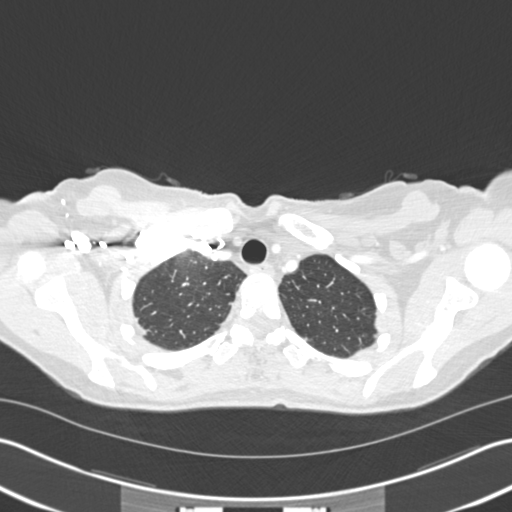
[im 272/306  mediastinal]
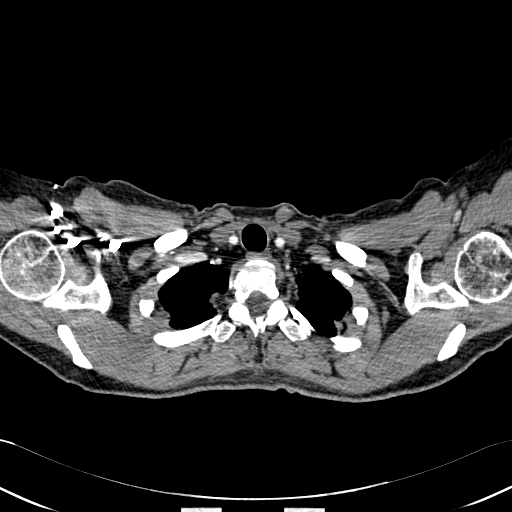
[im 289/306  lung]
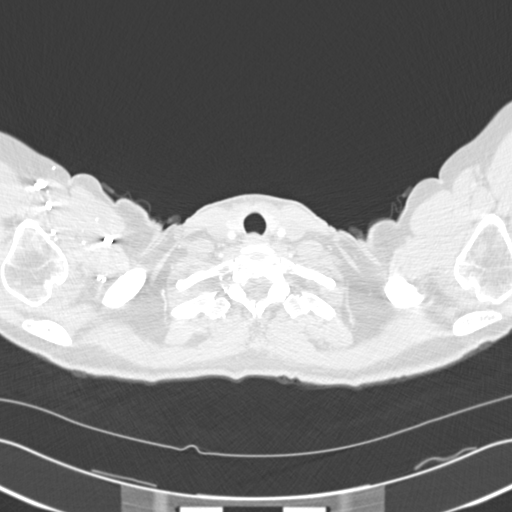

[Series 11: cor pe 2.0 mpr · coronal · 0.60mm/px · 1 of 119 slices shown]
[im 60/119  mediastinal]
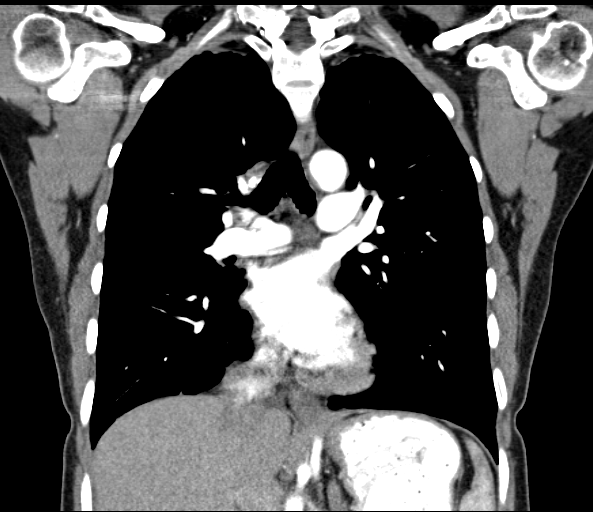

[18 of 36 positions shown; findings below may reference images not displayed]

CONCLUSION: 1.  No evidence of pulmonary embolus.

2.  Right pleural effusion.

3. Bilateral breast implants.
FINDINGS: Liver normal. Spleen normal. Pancreas normal. No biliary distention.
The gallbladder is nondistended.

Adrenals normal. Solitary left kidney appears normal. No
hydronephrosis or obstructing ureteral stone. The bladder is
nondistended. Partial hysterectomy. Air in the vagina. No adnexal
mass. Small amount of free pelvic fluid.

No adenopathy. Aorta and visceral vessels patent. Portal vein
patent.

Surgical clips are noted adjacent to the right colon consistent with
patient's recent colon surgery. There is adjacent small
approximately 3 by 2 cm complex fluid collection in the right
pericolic gutter. This could be postoperative fluid. A small
developing abscess cannot be excluded. No air is noted within this
small fluid collection. Mild sigmoid, left, and transverse colonic
wall thickening cannot be excluded. Colitis cannot be excluded. No
evidence of bowel obstruction. No free intraperitoneal air. Tiny
hiatal hernia. No significant abdominal wall hernia. Tiny umbilical
hernia with herniation of fat only noted.

Mild bibasilar atelectasis. Moderate right pleural effusion. Heart
size normal. Bilateral breast implants. No acute bony abnormality.
Small amount of air noted in the anterior abdominal wall, possibly
from injections.
IMPRESSION: 1. 3 x 2 cm complex fluid collection in the right pericolic gutter
adjacent to the of prior surgical site. This could be a seroma
however a small developing abscess cannot be excluded. No air is
noted within the small fluid collection.

2. Diffuse sigmoid, left, and transverse colonic mild wall
thickening. Colitis cannot be excluded.

3.Mild bibasilar atelectasis.  Moderate right pleural effusion.

4. Solitary left kidney.  This appears normal.

## 2016-03-08 ENCOUNTER — Ambulatory Visit: Payer: BLUE CROSS/BLUE SHIELD

## 2016-03-08 ENCOUNTER — Inpatient Hospital Stay: Payer: BLUE CROSS/BLUE SHIELD

## 2016-03-10 ENCOUNTER — Inpatient Hospital Stay: Payer: BLUE CROSS/BLUE SHIELD | Admitting: Internal Medicine

## 2016-03-22 ENCOUNTER — Other Ambulatory Visit: Payer: Self-pay | Admitting: Obstetrics and Gynecology

## 2016-03-22 DIAGNOSIS — Z1231 Encounter for screening mammogram for malignant neoplasm of breast: Secondary | ICD-10-CM

## 2016-05-12 ENCOUNTER — Ambulatory Visit: Payer: BLUE CROSS/BLUE SHIELD

## 2016-06-09 ENCOUNTER — Ambulatory Visit
Admission: RE | Admit: 2016-06-09 | Discharge: 2016-06-09 | Disposition: A | Discharge status: Home / Self Care | Payer: BLUE CROSS/BLUE SHIELD | Source: Ambulatory Visit | Attending: Obstetrics and Gynecology | Admitting: Obstetrics and Gynecology

## 2016-06-09 ENCOUNTER — Other Ambulatory Visit: Payer: Self-pay | Admitting: Obstetrics and Gynecology

## 2016-06-09 DIAGNOSIS — Z1231 Encounter for screening mammogram for malignant neoplasm of breast: Secondary | ICD-10-CM | POA: Insufficient documentation

## 2017-01-12 ENCOUNTER — Other Ambulatory Visit: Payer: Self-pay | Admitting: Family Medicine

## 2017-01-12 DIAGNOSIS — Z85038 Personal history of other malignant neoplasm of large intestine: Secondary | ICD-10-CM

## 2017-01-17 ENCOUNTER — Ambulatory Visit: Admission: RE | Admit: 2017-01-17 | Payer: BLUE CROSS/BLUE SHIELD | Source: Ambulatory Visit

## 2017-01-18 ENCOUNTER — Ambulatory Visit: Payer: BLUE CROSS/BLUE SHIELD

## 2017-01-24 ENCOUNTER — Ambulatory Visit: Admission: RE | Admit: 2017-01-24 | Payer: BLUE CROSS/BLUE SHIELD | Source: Ambulatory Visit

## 2019-01-29 ENCOUNTER — Other Ambulatory Visit: Payer: Self-pay

## 2019-01-29 DIAGNOSIS — Z20822 Contact with and (suspected) exposure to covid-19: Secondary | ICD-10-CM

## 2019-01-29 DIAGNOSIS — Z20828 Contact with and (suspected) exposure to other viral communicable diseases: Secondary | ICD-10-CM

## 2019-01-31 LAB — NOVEL CORONAVIRUS, NAA: SARS-CoV-2, NAA: NOT DETECTED

## 2019-06-04 DIAGNOSIS — D229 Melanocytic nevi, unspecified: Secondary | ICD-10-CM

## 2019-06-04 HISTORY — DX: Melanocytic nevi, unspecified: D22.9

## 2019-08-13 ENCOUNTER — Ambulatory Visit (INDEPENDENT_AMBULATORY_CARE_PROVIDER_SITE_OTHER): Payer: 59 | Admitting: Dermatology

## 2019-08-13 ENCOUNTER — Other Ambulatory Visit: Payer: Self-pay

## 2019-08-13 DIAGNOSIS — D2261 Melanocytic nevi of right upper limb, including shoulder: Secondary | ICD-10-CM | POA: Diagnosis not present

## 2019-08-13 DIAGNOSIS — D239 Other benign neoplasm of skin, unspecified: Secondary | ICD-10-CM

## 2019-08-13 NOTE — Patient Instructions (Signed)

## 2019-08-13 NOTE — Progress Notes (Addendum)
   Follow-Up Visit   Subjective  Brittany Horne is a 54 y.o. female who presents for the following: Atypical melanocytic proliferation (R post upper arm, bx 06/04/19).  The following portions of the chart were reviewed this encounter and updated as appropriate:     Review of Systems: No other skin or systemic complaints.  Objective  Well appearing patient in no apparent distress; mood and affect are within normal limits.  A focused examination was performed including right posterior upper arm. Relevant physical exam findings are noted in the Assessment and Plan.  Objective  Right Posterior Upper Arm: 6.56mm Pink biopsy site.  Assessment & Plan  Dysplastic nevus Right Posterior Upper Arm  Bx proven Atypical Intraepidermal Melanocytic Proliferation  Skin excision - Right Posterior Upper Arm  Lesion length (cm):  0.6 Lesion width (cm):  0.6 Margin per side (cm):  0.2 Total excision diameter (cm):  1 Informed consent: discussed and consent obtained   Timeout: patient name, date of birth, surgical site, and procedure verified   Procedure prep:  Patient was prepped and draped in usual sterile fashion Prep type:  Povidone-iodine Anesthesia: the lesion was anesthetized in a standard fashion   Anesthetic:  1% lidocaine w/ epinephrine 1-100,000 buffered w/ 8.4% NaHCO3 (11cc) Instrument used: #15 blade   Hemostasis achieved with: pressure and electrodesiccation   Outcome: patient tolerated procedure well with no complications   Post-procedure details: sterile dressing applied and wound care instructions given   Dressing type: petrolatum and pressure dressing    Skin repair - Right Posterior Upper Arm Complexity:  Intermediate Final length (cm):  2.4 Reason for type of repair: reduce tension to allow closure   Undermining: edges could be approximated without difficulty and edges undermined   Subcutaneous layers (deep stitches):  Suture size:  4-0 Suture type: Vicryl (polyglactin  910)   Stitches:  Buried vertical mattress Fine/surface layer approximation (top stitches):  Suture size:  4-0 Suture type comment:  Nylon Stitches: simple interrupted   Suture removal (days):  7 Hemostasis achieved with: suture Outcome: patient tolerated procedure well with no complications   Post-procedure details: sterile dressing applied and wound care instructions given   Dressing type: pressure dressing (mupirocin)    Specimen 1 - Surgical pathology Differential Diagnosis: Biopsy proven Atypical Melanocytic Proliferation Check Margins: Yes Pink biopsy site. [id number] Tag at lateral 3:00  Return in about 1 week (around 08/20/2019) for sr.   Documentation: I have reviewed the above documentation for accuracy and completeness, and I agree with the above.  Brendolyn Patty MD  I, Jamesetta Orleans, CMA, am acting as scribe for Brendolyn Patty, MD .

## 2019-08-14 ENCOUNTER — Telehealth: Payer: Self-pay

## 2019-08-14 NOTE — Telephone Encounter (Signed)
Talked to patient and she is doing fine from surgery yesterday. She will call with any problems or concerns.

## 2019-08-20 ENCOUNTER — Ambulatory Visit (INDEPENDENT_AMBULATORY_CARE_PROVIDER_SITE_OTHER): Payer: 59 | Admitting: Dermatology

## 2019-08-20 ENCOUNTER — Other Ambulatory Visit: Payer: Self-pay

## 2019-08-20 DIAGNOSIS — D239 Other benign neoplasm of skin, unspecified: Secondary | ICD-10-CM

## 2019-08-20 DIAGNOSIS — D2361 Other benign neoplasm of skin of right upper limb, including shoulder: Secondary | ICD-10-CM

## 2019-08-20 DIAGNOSIS — Z4802 Encounter for removal of sutures: Secondary | ICD-10-CM

## 2019-08-20 NOTE — Progress Notes (Signed)
   Follow-Up Visit   Subjective  Brittany Horne is a 54 y.o. female who presents for the following: post op 1 wk s/p surgery (R post upper arm Atypical Nevus margins clear).   The following portions of the chart were reviewed this encounter and updated as appropriate:      Review of Systems:  No other skin or systemic complaints except as noted in HPI or Assessment and Plan.  Objective  Well appearing patient in no apparent distress; mood and affect are within normal limits.  A focused examination was performed including R posterior arm. Relevant physical exam findings are noted in the Assessment and Plan.  Objective  Right posterior upper arm: Healing excision site, no evidence of infection   Assessment & Plan  Dysplastic nevus Right posterior upper arm  Wound cleansed, sutures removed, wound cleansed and steri strips applied. Discussed pathology results. Margins free  May start Serica scar cream once steri strips fall off.  Return in about 1 year (around 08/19/2020) for TBSE.    Documentation: I have reviewed the above documentation for accuracy and completeness, and I agree with the above.  Brendolyn Patty, MD   I, Othelia Pulling, RMA, am acting as scribe for Brendolyn Patty, MD .

## 2019-11-12 ENCOUNTER — Other Ambulatory Visit: Payer: Self-pay | Admitting: Otolaryngology

## 2019-11-12 DIAGNOSIS — IMO0001 Reserved for inherently not codable concepts without codable children: Secondary | ICD-10-CM

## 2019-11-12 DIAGNOSIS — H9202 Otalgia, left ear: Secondary | ICD-10-CM

## 2019-11-20 ENCOUNTER — Other Ambulatory Visit: Payer: Self-pay | Admitting: Obstetrics and Gynecology

## 2019-11-20 DIAGNOSIS — Z1231 Encounter for screening mammogram for malignant neoplasm of breast: Secondary | ICD-10-CM

## 2019-11-28 ENCOUNTER — Other Ambulatory Visit: Payer: Self-pay

## 2019-11-28 ENCOUNTER — Ambulatory Visit
Admission: RE | Admit: 2019-11-28 | Discharge: 2019-11-28 | Disposition: A | Discharge status: Home / Self Care | Payer: 59 | Source: Ambulatory Visit | Attending: Otolaryngology | Admitting: Otolaryngology

## 2019-11-28 DIAGNOSIS — H9042 Sensorineural hearing loss, unilateral, left ear, with unrestricted hearing on the contralateral side: Secondary | ICD-10-CM | POA: Diagnosis present

## 2019-11-28 DIAGNOSIS — H9202 Otalgia, left ear: Secondary | ICD-10-CM | POA: Diagnosis present

## 2019-11-28 DIAGNOSIS — IMO0001 Reserved for inherently not codable concepts without codable children: Secondary | ICD-10-CM

## 2020-01-04 ENCOUNTER — Ambulatory Visit
Admission: RE | Admit: 2020-01-04 | Discharge: 2020-01-04 | Disposition: A | Discharge status: Home / Self Care | Payer: 59 | Source: Ambulatory Visit | Attending: Obstetrics and Gynecology | Admitting: Obstetrics and Gynecology

## 2020-01-04 DIAGNOSIS — Z1231 Encounter for screening mammogram for malignant neoplasm of breast: Secondary | ICD-10-CM | POA: Diagnosis not present

## 2020-01-11 ENCOUNTER — Other Ambulatory Visit: Payer: Self-pay | Admitting: *Deleted

## 2020-01-11 ENCOUNTER — Inpatient Hospital Stay
Admission: RE | Admit: 2020-01-11 | Discharge: 2020-01-11 | Disposition: A | Discharge status: Home / Self Care | Payer: Self-pay | Source: Ambulatory Visit | Attending: *Deleted | Admitting: *Deleted

## 2020-01-11 DIAGNOSIS — Z1231 Encounter for screening mammogram for malignant neoplasm of breast: Secondary | ICD-10-CM

## 2020-01-22 ENCOUNTER — Other Ambulatory Visit: Payer: Self-pay | Admitting: Family Medicine

## 2020-01-22 DIAGNOSIS — Z85038 Personal history of other malignant neoplasm of large intestine: Secondary | ICD-10-CM

## 2020-02-06 ENCOUNTER — Other Ambulatory Visit: Payer: Self-pay

## 2020-02-06 ENCOUNTER — Ambulatory Visit
Admission: RE | Admit: 2020-02-06 | Discharge: 2020-02-06 | Disposition: A | Discharge status: Home / Self Care | Payer: 59 | Source: Ambulatory Visit | Attending: Family Medicine | Admitting: Family Medicine

## 2020-02-06 DIAGNOSIS — Z85038 Personal history of other malignant neoplasm of large intestine: Secondary | ICD-10-CM | POA: Insufficient documentation

## 2020-02-06 LAB — POCT I-STAT CREATININE: Creatinine, Ser: 0.8 mg/dL (ref 0.44–1.00)

## 2020-02-06 MED ORDER — IOHEXOL 300 MG/ML  SOLN
100.0000 mL | Freq: Once | INTRAMUSCULAR | Status: AC | PRN
  Administered 2020-02-06: 100 mL via INTRAVENOUS

## 2020-03-13 ENCOUNTER — Other Ambulatory Visit: Payer: Self-pay

## 2020-03-13 ENCOUNTER — Other Ambulatory Visit
Admission: RE | Admit: 2020-03-13 | Discharge: 2020-03-13 | Disposition: A | Discharge status: Home / Self Care | Payer: 59 | Source: Ambulatory Visit | Attending: Gastroenterology | Admitting: Gastroenterology

## 2020-03-13 DIAGNOSIS — Z20822 Contact with and (suspected) exposure to covid-19: Secondary | ICD-10-CM | POA: Insufficient documentation

## 2020-03-13 DIAGNOSIS — Z01812 Encounter for preprocedural laboratory examination: Secondary | ICD-10-CM | POA: Diagnosis present

## 2020-03-14 ENCOUNTER — Encounter: Payer: Self-pay | Admitting: *Deleted

## 2020-03-14 LAB — SARS CORONAVIRUS 2 (TAT 6-24 HRS): SARS Coronavirus 2: NEGATIVE

## 2020-03-17 ENCOUNTER — Ambulatory Visit: Payer: 59 | Admitting: Anesthesiology

## 2020-03-17 ENCOUNTER — Encounter
Admission: RE | Disposition: A | Discharge status: Home / Self Care | Payer: Self-pay | Source: Home / Self Care | Attending: Gastroenterology

## 2020-03-17 ENCOUNTER — Ambulatory Visit
Admission: RE | Admit: 2020-03-17 | Discharge: 2020-03-17 | Disposition: A | Discharge status: Home / Self Care | Payer: 59 | Attending: Gastroenterology | Admitting: Gastroenterology

## 2020-03-17 ENCOUNTER — Other Ambulatory Visit: Payer: Self-pay

## 2020-03-17 ENCOUNTER — Encounter: Payer: Self-pay | Admitting: *Deleted

## 2020-03-17 DIAGNOSIS — D123 Benign neoplasm of transverse colon: Secondary | ICD-10-CM | POA: Diagnosis not present

## 2020-03-17 DIAGNOSIS — Z9049 Acquired absence of other specified parts of digestive tract: Secondary | ICD-10-CM | POA: Diagnosis not present

## 2020-03-17 DIAGNOSIS — Z980 Intestinal bypass and anastomosis status: Secondary | ICD-10-CM | POA: Diagnosis not present

## 2020-03-17 DIAGNOSIS — D124 Benign neoplasm of descending colon: Secondary | ICD-10-CM | POA: Diagnosis not present

## 2020-03-17 DIAGNOSIS — Z09 Encounter for follow-up examination after completed treatment for conditions other than malignant neoplasm: Secondary | ICD-10-CM | POA: Insufficient documentation

## 2020-03-17 DIAGNOSIS — Z85038 Personal history of other malignant neoplasm of large intestine: Secondary | ICD-10-CM | POA: Diagnosis not present

## 2020-03-17 HISTORY — PX: COLONOSCOPY WITH PROPOFOL: SHX5780

## 2020-03-17 HISTORY — DX: Anemia, unspecified: D64.9

## 2020-03-17 HISTORY — DX: Anxiety disorder, unspecified: F41.9

## 2020-03-17 SURGERY — COLONOSCOPY WITH PROPOFOL
Anesthesia: General

## 2020-03-17 MED ORDER — PROPOFOL 10 MG/ML IV BOLUS
INTRAVENOUS | Status: DC | PRN
  Administered 2020-03-17: 20 mg via INTRAVENOUS
  Administered 2020-03-17: 60 mg via INTRAVENOUS

## 2020-03-17 MED ORDER — PROPOFOL 500 MG/50ML IV EMUL
INTRAVENOUS | Status: DC | PRN
  Administered 2020-03-17: 200 ug/kg/min via INTRAVENOUS

## 2020-03-17 MED ORDER — SODIUM CHLORIDE 0.9 % IV SOLN
INTRAVENOUS | Status: DC
  Administered 2020-03-17: 1000 mL via INTRAVENOUS

## 2020-03-17 NOTE — H&P (Signed)
Outpatient short stay form Pre-procedure 03/17/2020 8:28 AM Brittany Miyamoto MD, MPH  Primary Physician: Dr. Kary Kos  Reason for visit:  History of colon cancer  History of present illness:   54 y/o lady with history of colon cancer around 51 here for surveillance. Had right hemicolectomy and follow-up colon's have been normal. No other family history of GI malignancies. No other abdominal surgeries besides c-sections. No blood thinners.    Current Facility-Administered Medications:  .  0.9 %  sodium chloride infusion, , Intravenous, Continuous, Godson Pollan, Hilton Cork, MD, Last Rate: 20 mL/hr at 03/17/20 0815, 1,000 mL at 03/17/20 0815  Facility-Administered Medications Ordered in Other Encounters:  .  sodium chloride 0.9 % injection 10 mL, 10 mL, Intravenous, PRN, Ma Hillock, Sandeep, MD .  sodium chloride 0.9 % injection 10 mL, 10 mL, Intravenous, PRN, Leia Alf, MD, 10 mL at 09/25/14 1206  Medications Prior to Admission  Medication Sig Dispense Refill Last Dose  . cholecalciferol (VITAMIN D3) 25 MCG (1000 UNIT) tablet Take 1,000 Units by mouth daily.   Past Week at Unknown time  . estradiol (ESTRACE) 0.5 MG tablet Take 0.5 mg by mouth daily.   Past Week at Unknown time  . Multiple Vitamin (MULTIVITAMIN PO) Take by mouth. Take by mouth Juice Plus   Past Week at Unknown time     No Known Allergies   Past Medical History:  Diagnosis Date  . Anemia   . Anxiety   . Atypical mole 06/04/2019   Right posterior upper arm. AMP. Excised: 08/13/2019, margins free  . Colon cancer ([place]) 2015   chemo  . Colon cancer ([place]) 10/20/2014    Review of systems:  Otherwise negative.    Physical Exam  Gen: Alert, oriented. Appears stated age.  HEENT: PERRLA. Lungs: No respiratory distress CV: RRR Abd: soft, benign, no masses. Ext: No edema.     Planned procedures: Proceed with colonoscopy. The patient understands the nature of the planned procedure, indications, risks, alternatives and  potential complications including but not limited to bleeding, infection, perforation, damage to internal organs and possible oversedation/side effects from anesthesia. The patient agrees and gives consent to proceed.  Please refer to procedure notes for findings, recommendations and patient disposition/instructions.     Brittany Miyamoto MD, MPH Gastroenterology 03/17/2020  8:28 AM

## 2020-03-17 NOTE — Anesthesia Procedure Notes (Signed)
Date/Time: 03/17/2020 8:41 AM Performed by: Nelda Marseille, CRNA Pre-anesthesia Checklist: Patient identified, Emergency Drugs available, Suction available, Patient being monitored and Timeout performed Oxygen Delivery Method: Nasal cannula

## 2020-03-17 NOTE — Transfer of Care (Signed)
Immediate Anesthesia Transfer of Care Note  Patient: CAYA SOBERANIS  Procedure(s) Performed: COLONOSCOPY WITH PROPOFOL (N/A )  Patient Location: PACU  Anesthesia Type:General  Level of Consciousness: awake and sedated  Airway & Oxygen Therapy: Patient Spontanous Breathing and Patient connected to nasal cannula oxygen  Post-op Assessment: Report given to RN and Post -op Vital signs reviewed and stable  Post vital signs: Reviewed and stable  Last Vitals:  Vitals Value Taken Time  BP 116/74 03/17/20 0913  Temp 36.7 C 03/17/20 0911  Pulse 81 03/17/20 0915  Resp 16 03/17/20 0915  SpO2 98 % 03/17/20 0915  Vitals shown include unvalidated device data.  Last Pain:  Vitals:   03/17/20 0911  TempSrc: Temporal  PainSc: 0-No pain         Complications: No complications documented.

## 2020-03-17 NOTE — Anesthesia Preprocedure Evaluation (Signed)
Anesthesia Evaluation  Patient identified by MRN, date of birth, ID band Patient awake    Reviewed: Allergy & Precautions, H&P , NPO status , Patient's Chart, lab work & pertinent test results, reviewed documented beta blocker date and time   Airway Mallampati: II   Neck ROM: full    Dental  (+) Poor Dentition   Pulmonary neg pulmonary ROS,    Pulmonary exam normal        Cardiovascular Exercise Tolerance: Good negative cardio ROS Normal cardiovascular exam Rhythm:regular Rate:Normal     Neuro/Psych Anxiety negative neurological ROS  negative psych ROS   GI/Hepatic negative GI ROS, Neg liver ROS,   Endo/Other  negative endocrine ROS  Renal/GU negative Renal ROS  negative genitourinary   Musculoskeletal   Abdominal   Peds  Hematology  (+) Blood dyscrasia, anemia ,   Anesthesia Other Findings Past Medical History: No date: Anemia No date: Anxiety 06/04/2019: Atypical mole     Comment:  Right posterior upper arm. AMP. Excised: 08/13/2019,               margins free 2015: Colon cancer ([place])     Comment:  chemo 10/20/2014: Colon cancer [facility]) Past Surgical History: No date: ABDOMINAL HYSTERECTOMY 2004: AUGMENTATION MAMMAPLASTY; Bilateral No date: CESAREAN SECTION No date: COLON SURGERY 09/05/2014: COLONOSCOPY WITH PROPOFOL; N/A     Comment:  Procedure: COLONOSCOPY WITH PROPOFOL;  Surgeon: Josefine Class, MD;  Location: [facility] ENDOSCOPY;  Service:               Endoscopy;  Laterality: N/A; 2016: PORT-A-CATH REMOVAL BMI    Body Mass Index: 26.37 kg/m     Reproductive/Obstetrics negative OB ROS                             Anesthesia Physical Anesthesia Plan  ASA: II  Anesthesia Plan: General   Post-op Pain Management:    Induction:   PONV Risk Score and Plan:   Airway Management Planned:   Additional Equipment:   Intra-op Plan:   Post-operative Plan:    Informed Consent: I have reviewed the patients History and Physical, chart, labs and discussed the procedure including the risks, benefits and alternatives for the proposed anesthesia with the patient or authorized representative who has indicated his/her understanding and acceptance.     Dental Advisory Given  Plan Discussed with: CRNA  Anesthesia Plan Comments:         Anesthesia Quick Evaluation

## 2020-03-17 NOTE — Op Note (Signed)
[facility] Patient Name: Brittany Horne Procedure Date: 03/17/2020 8:31 AM MRN: [id number] Account #: [id number] Date of Birth: 1965/07/09 Admit Type: Outpatient Age: 54 Room: [facility] ENDO ROOM 3 Gender: Female Note Status: Finalized Procedure:             Colonoscopy Indications:           High risk colon cancer surveillance: Personal history                         of colon cancer Providers:             Andrey Spargur MD, MD Medicines:             Monitored Anesthesia Care Complications:         No immediate complications. Estimated blood loss:                         Minimal. Procedure:             Pre-Anesthesia Assessment:                        - Prior to the procedure, a History and Physical was                         performed, and patient medications and allergies were                         reviewed. The patient is competent. The risks and                         benefits of the procedure and the sedation options and                         risks were discussed with the patient. All questions                         were answered and informed consent was obtained.                         Patient identification and proposed procedure were                         verified by the physician, the nurse, the anesthetist                         and the technician in the endoscopy suite. Mental                         Status Examination: alert and oriented. Airway                         Examination: normal oropharyngeal airway and neck                         mobility. Respiratory Examination: clear to                         auscultation. CV Examination: normal. Prophylactic  Antibiotics: The patient does not require prophylactic                         antibiotics. Prior Anticoagulants: The patient has                         taken no previous anticoagulant or antiplatelet                         agents. ASA Grade  Assessment: II - A patient with mild                         systemic disease. After reviewing the risks and                         benefits, the patient was deemed in satisfactory                         condition to undergo the procedure. The anesthesia                         plan was to use monitored anesthesia care (MAC).                         Immediately prior to administration of medications,                         the patient was re-assessed for adequacy to receive                         sedatives. The heart rate, respiratory rate, oxygen                         saturations, blood pressure, adequacy of pulmonary                         ventilation, and response to care were monitored                         throughout the procedure. The physical status of the                         patient was re-assessed after the procedure.                        After obtaining informed consent, the colonoscope was                         passed under direct vision. Throughout the procedure,                         the patient's blood pressure, pulse, and oxygen                         saturations were monitored continuously. The                         Colonoscope was introduced through the anus and  advanced to the the ileocolonic anastomosis. The                         colonoscopy was somewhat difficult due to significant                         looping. Successful completion of the procedure was                         aided by applying abdominal pressure. The patient                         tolerated the procedure well. The quality of the bowel                         preparation was good. Findings:      The perianal and digital rectal examinations were normal.      There was evidence of a prior surgical anastomosis in the ascending       colon. This was patent.      A 3 mm polyp was found in the distal transverse colon. The polyp was       sessile. The polyp  was removed with a cold snare. Resection and       retrieval were complete. Estimated blood loss was minimal.      A 3 mm polyp was found in the proximal descending colon. The polyp was       sessile. The polyp was removed with a cold snare. Resection and       retrieval were complete. Estimated blood loss was minimal.      The exam was otherwise without abnormality on direct and retroflexion       views. Impression:            - Patent surgical anastomosis.                        - One 3 mm polyp in the distal transverse colon,                         removed with a cold snare. Resected and retrieved.                        - One 3 mm polyp in the proximal descending colon,                         removed with a cold snare. Resected and retrieved.                        - The examination was otherwise normal on direct and                         retroflexion views. Recommendation:        - Discharge patient to home.                        - Resume previous diet.                        - Continue present medications.                        -  Await pathology results.                        - Repeat colonoscopy for surveillance based on                         pathology results.                        - Return to referring physician as previously                         scheduled. Procedure Code(s):     --- Professional ---                        [phone], Colonoscopy, flexible; with removal of                         tumor(s), polyp(s), or other lesion(s) by snare                         technique Diagnosis Code(s):     --- Professional ---                        U03.709, Personal history of other malignant neoplasm                         of large intestine                        Z98.0, Intestinal bypass and anastomosis status                        K63.5, Polyp of colon CPT copyright 2019 American Medical Association. All rights reserved. The codes documented in this report are  preliminary and upon coder review may  be revised to meet current compliance requirements. Andrey Quirion, MD Andrey Greenspan MD, MD 03/17/2020 9:12:56 AM Number of Addenda: 0 Note Initiated On: 03/17/2020 8:31 AM Scope Withdrawal Time: 0 hours 6 minutes 36 seconds  Total Procedure Duration: 0 hours 30 minutes 52 seconds  Estimated Blood Loss:  Estimated blood loss was minimal.      [facility]

## 2020-03-17 NOTE — Anesthesia Postprocedure Evaluation (Signed)
Anesthesia Post Note  Patient: Brittany Horne  Procedure(s) Performed: COLONOSCOPY WITH PROPOFOL (N/A )  Patient location during evaluation: PACU Anesthesia Type: General Level of consciousness: awake and alert Pain management: pain level controlled Vital Signs Assessment: post-procedure vital signs reviewed and stable Respiratory status: spontaneous breathing, nonlabored ventilation, respiratory function stable and patient connected to nasal cannula oxygen Cardiovascular status: blood pressure returned to baseline and stable Postop Assessment: no apparent nausea or vomiting Anesthetic complications: no   No complications documented.   Last Vitals:  Vitals:   03/17/20 0931 03/17/20 0941  BP: 125/64 117/72  Pulse:    Resp:    Temp:    SpO2:      Last Pain:  Vitals:   03/17/20 0941  TempSrc:   PainSc: 0-No pain                 Molli Barrows

## 2020-03-17 NOTE — Interval H&P Note (Signed)
History and Physical Interval Note:  03/17/2020 8:30 AM  Brittany Horne  has presented today for surgery, with the diagnosis of p hx colon cancer.  The various methods of treatment have been discussed with the patient and family. After consideration of risks, benefits and other options for treatment, the patient has consented to  Procedure(s): COLONOSCOPY WITH PROPOFOL (N/A) as a surgical intervention.  The patient's history has been reviewed, patient examined, no change in status, stable for surgery.  I have reviewed the patient's chart and labs.  Questions were answered to the patient's satisfaction.     [name]  Ok to proceed with colonoscopy

## 2020-03-17 NOTE — Addendum Note (Signed)
Addendum  created 03/17/20 1100 by Nelda Marseille, CRNA   Intraprocedure Event edited

## 2020-03-18 ENCOUNTER — Encounter: Payer: Self-pay | Admitting: Gastroenterology

## 2020-03-18 LAB — SURGICAL PATHOLOGY

## 2020-04-01 ENCOUNTER — Other Ambulatory Visit: Payer: Self-pay | Admitting: Podiatry

## 2020-04-02 ENCOUNTER — Other Ambulatory Visit: Payer: Self-pay

## 2020-04-02 ENCOUNTER — Encounter: Payer: Self-pay | Admitting: Podiatry

## 2020-04-03 ENCOUNTER — Encounter: Payer: Self-pay | Admitting: Anesthesiology

## 2020-04-07 ENCOUNTER — Other Ambulatory Visit
Admission: RE | Admit: 2020-04-07 | Discharge: 2020-04-07 | Disposition: A | Discharge status: Home / Self Care | Payer: 59 | Source: Ambulatory Visit | Attending: Podiatry | Admitting: Podiatry

## 2020-04-07 ENCOUNTER — Other Ambulatory Visit: Payer: Self-pay

## 2020-04-07 DIAGNOSIS — U071 COVID-19: Secondary | ICD-10-CM | POA: Insufficient documentation

## 2020-04-07 DIAGNOSIS — Z01812 Encounter for preprocedural laboratory examination: Secondary | ICD-10-CM | POA: Insufficient documentation

## 2020-04-07 HISTORY — DX: COVID-19: U07.1

## 2020-04-07 LAB — SARS CORONAVIRUS 2 (TAT 6-24 HRS): SARS Coronavirus 2: POSITIVE — AB

## 2020-04-07 NOTE — Discharge Instructions (Signed)
[facility]  POST OPERATIVE INSTRUCTIONS FOR DR. TROXLER, DR. FOWLER, AND DR. [facility]   1. Take your medication as prescribed.  Pain medication should be taken only as needed.  2. Keep the dressing clean, dry and intact.  3. Keep your foot elevated above the heart level for the first 48 hours.  4. Walking to the bathroom and brief periods of walking are acceptable, unless we have instructed you to be non-weight bearing.  5. Always wear your post-op shoe when walking.  Always use your crutches if you are to be non-weight bearing.  6. Do not take a shower. Baths are permissible as long as the foot is kept out of the water.   7. Every hour you are awake:  - Bend your knee 15 times. - Flex foot 15 times - Massage calf 15 times  8. Call [facility] ([phone]) if any of the following problems occur: - You develop a temperature or fever. - The bandage becomes saturated with blood. - Medication does not stop your pain. - Injury of the foot occurs. - Any symptoms of infection including redness, odor, or red streaks running from wound.   General Anesthesia, Adult, Care After This sheet gives you information about how to care for yourself after your procedure. Your health care provider may also give you more specific instructions. If you have problems or questions, contact your health care provider. What can I expect after the procedure? After the procedure, the following side effects are common:  Pain or discomfort at the IV site.  Nausea.  Vomiting.  Sore throat.  Trouble concentrating.  Feeling cold or chills.  Weak or tired.  Sleepiness and fatigue.  Soreness and body aches. These side effects can affect parts of the body that were not involved in surgery. Follow these instructions at home:  For at least 24 hours after the procedure:  Have a responsible adult stay with you. It is  important to have someone help care for you until you are awake and alert.  Rest as needed.  Do not: ? Participate in activities in which you could fall or become injured. ? Drive. ? Use heavy machinery. ? Drink alcohol. ? Take sleeping pills or medicines that cause drowsiness. ? Make important decisions or sign legal documents. ? Take care of children on your own. Eating and drinking  Follow any instructions from your health care provider about eating or drinking restrictions.  When you feel hungry, start by eating small amounts of foods that are soft and easy to digest (bland), such as toast. Gradually return to your regular diet.  Drink enough fluid to keep your urine pale yellow.  If you vomit, rehydrate by drinking water, juice, or clear broth. General instructions  If you have sleep apnea, surgery and certain medicines can increase your risk for breathing problems. Follow instructions from your health care provider about wearing your sleep device: ? Anytime you are sleeping, including during daytime naps. ? While taking prescription pain medicines, sleeping medicines, or medicines that make you drowsy.  Return to your normal activities as told by your health care provider. Ask your health care provider what activities are safe for you.  Take over-the-counter and prescription medicines only as told by your health care provider.  If you smoke, do not smoke without supervision.  Keep all follow-up visits as told by your health care provider. This is important. Contact a health care provider if:    You have nausea or vomiting that does not get better with medicine.  You cannot eat or drink without vomiting.  You have pain that does not get better with medicine.  You are unable to pass urine.  You develop a skin rash.  You have a fever.  You have redness around your IV site that gets worse. Get help right away if:  You have difficulty breathing.  You have chest  pain.  You have blood in your urine or stool, or you vomit blood. Summary  After the procedure, it is common to have a sore throat or nausea. It is also common to feel tired.  Have a responsible adult stay with you for the first 24 hours after general anesthesia. It is important to have someone help care for you until you are awake and alert.  When you feel hungry, start by eating small amounts of foods that are soft and easy to digest (bland), such as toast. Gradually return to your regular diet.  Drink enough fluid to keep your urine pale yellow.  Return to your normal activities as told by your health care provider. Ask your health care provider what activities are safe for you. This information is not intended to replace advice given to you by your health care provider. Make sure you discuss any questions you have with your health care provider. Document Revised: 04/15/2017 Document Reviewed: 11/26/2016 Elsevier Patient Education  2020 Elsevier Inc.  

## 2020-04-08 ENCOUNTER — Telehealth: Payer: Self-pay | Admitting: Nurse Practitioner

## 2020-04-08 NOTE — Telephone Encounter (Signed)
Called to Discuss with patient about Covid symptoms and the use of the monoclonal antibody infusion for those with mild to moderate Covid symptoms and at a high risk of hospitalization.     Pt appears to qualify for this infusion due to co-morbid conditions and/or a member of an at-risk group in accordance with the FDA Emergency Use Authorization. (BMI, Colon Ca)    Unable to reach pt. Voicemail left and My Chart message sent.   Alda Lea, NP [facility]  [phone]

## 2020-04-09 ENCOUNTER — Ambulatory Visit: Admission: RE | Admit: 2020-04-09 | Payer: 59 | Source: Home / Self Care | Admitting: Podiatry

## 2020-04-09 HISTORY — DX: Unspecified osteoarthritis, unspecified site: M19.90

## 2020-04-09 HISTORY — DX: Congenital malformation of kidney, unspecified: Q63.9

## 2020-04-09 SURGERY — BUNIONECTOMY, LAPIDUS
Anesthesia: Choice | Laterality: Left

## 2020-04-10 ENCOUNTER — Other Ambulatory Visit: Payer: Self-pay

## 2020-04-10 ENCOUNTER — Other Ambulatory Visit: Payer: Self-pay | Admitting: Podiatry

## 2020-04-10 ENCOUNTER — Encounter: Payer: Self-pay | Admitting: Podiatry

## 2020-04-15 NOTE — Discharge Instructions (Signed)
[facility]  POST OPERATIVE INSTRUCTIONS FOR DR. TROXLER, DR. FOWLER, AND [facility]   1. Take your medication as prescribed.  Pain medication should be taken only as needed.  2. Keep the dressing clean, dry and intact.  3. Keep your foot elevated above the heart level for the first 48 hours.  4. Walking to the bathroom and brief periods of walking are acceptable, unless we have instructed you to be non-weight bearing.  5. Always wear your post-op shoe when walking.  Always use your crutches if you are to be non-weight bearing.  6. Do not take a shower. Baths are permissible as long as the foot is kept out of the water.   7. Every hour you are awake:  - Bend your knee 15 times. - Flex foot 15 times - Massage calf 15 times  8. Call [facility] ([phone]) if any of the following problems occur: - You develop a temperature or fever. - The bandage becomes saturated with blood. - Medication does not stop your pain. - Injury of the foot occurs. - Any symptoms of infection including redness, odor, or red streaks running from wound.   General Anesthesia, Adult, Care After This sheet gives you information about how to care for yourself after your procedure. Your health care provider may also give you more specific instructions. If you have problems or questions, contact your health care provider. What can I expect after the procedure? After the procedure, the following side effects are common:  Pain or discomfort at the IV site.  Nausea.  Vomiting.  Sore throat.  Trouble concentrating.  Feeling cold or chills.  Weak or tired.  Sleepiness and fatigue.  Soreness and body aches. These side effects can affect parts of the body that were not involved in surgery. Follow these instructions at home:  For at least 24 hours after the procedure:  Have a responsible adult stay with you. It is  important to have someone help care for you until you are awake and alert.  Rest as needed.  Do not: ? Participate in activities in which you could fall or become injured. ? Drive. ? Use heavy machinery. ? Drink alcohol. ? Take sleeping pills or medicines that cause drowsiness. ? Make important decisions or sign legal documents. ? Take care of children on your own. Eating and drinking  Follow any instructions from your health care provider about eating or drinking restrictions.  When you feel hungry, start by eating small amounts of foods that are soft and easy to digest (bland), such as toast. Gradually return to your regular diet.  Drink enough fluid to keep your urine pale yellow.  If you vomit, rehydrate by drinking water, juice, or clear broth. General instructions  If you have sleep apnea, surgery and certain medicines can increase your risk for breathing problems. Follow instructions from your health care provider about wearing your sleep device: ? Anytime you are sleeping, including during daytime naps. ? While taking prescription pain medicines, sleeping medicines, or medicines that make you drowsy.  Return to your normal activities as told by your health care provider. Ask your health care provider what activities are safe for you.  Take over-the-counter and prescription medicines only as told by your health care provider.  If you smoke, do not smoke without supervision.  Keep all follow-up visits as told by your health care provider. This is important. Contact a health care provider if:    You have nausea or vomiting that does not get better with medicine.  You cannot eat or drink without vomiting.  You have pain that does not get better with medicine.  You are unable to pass urine.  You develop a skin rash.  You have a fever.  You have redness around your IV site that gets worse. Get help right away if:  You have difficulty breathing.  You have chest  pain.  You have blood in your urine or stool, or you vomit blood. Summary  After the procedure, it is common to have a sore throat or nausea. It is also common to feel tired.  Have a responsible adult stay with you for the first 24 hours after general anesthesia. It is important to have someone help care for you until you are awake and alert.  When you feel hungry, start by eating small amounts of foods that are soft and easy to digest (bland), such as toast. Gradually return to your regular diet.  Drink enough fluid to keep your urine pale yellow.  Return to your normal activities as told by your health care provider. Ask your health care provider what activities are safe for you. This information is not intended to replace advice given to you by your health care provider. Make sure you discuss any questions you have with your health care provider. Document Revised: 04/15/2017 Document Reviewed: 11/26/2016 Elsevier Patient Education  2020 Elsevier Inc.  

## 2020-04-16 ENCOUNTER — Encounter: Payer: Self-pay | Admitting: Anesthesiology

## 2020-04-21 ENCOUNTER — Other Ambulatory Visit: Admission: RE | Admit: 2020-04-21 | Payer: 59 | Source: Ambulatory Visit

## 2020-04-22 ENCOUNTER — Other Ambulatory Visit: Payer: 59

## 2020-04-23 ENCOUNTER — Ambulatory Visit: Admission: RE | Admit: 2020-04-23 | Payer: 59 | Source: Home / Self Care | Admitting: Podiatry

## 2020-04-23 SURGERY — BUNIONECTOMY, LAPIDUS
Anesthesia: Choice | Laterality: Left

## 2020-06-03 ENCOUNTER — Other Ambulatory Visit: Payer: Self-pay

## 2020-06-03 ENCOUNTER — Ambulatory Visit (INDEPENDENT_AMBULATORY_CARE_PROVIDER_SITE_OTHER): Payer: 59 | Admitting: Dermatology

## 2020-06-03 DIAGNOSIS — L710 Perioral dermatitis: Secondary | ICD-10-CM | POA: Diagnosis not present

## 2020-06-03 DIAGNOSIS — L988 Other specified disorders of the skin and subcutaneous tissue: Secondary | ICD-10-CM

## 2020-06-03 NOTE — Progress Notes (Signed)
   Follow-Up Visit   Subjective  Brittany Horne is a 55 y.o. female who presents for the following: Facial Elastosis (Patient is here today for Botox - the last time she was injected was a few years ago). Patient has been experiencing papules under her nose and chin since November, and would like to discuss treatment options.   The following portions of the chart were reviewed this encounter and updated as appropriate:   Tobacco  Allergies  Meds  Problems  Med Hx  Surg Hx  Fam Hx     Review of Systems:  No other skin or systemic complaints except as noted in HPI or Assessment and Plan.  Objective  Well appearing patient in no apparent distress; mood and affect are within normal limits.  A focused examination was performed including the face . Relevant physical exam findings are noted in the Assessment and Plan.  Objective  the face: Rhytides and volume loss.   Images              Objective  Face: Papules of the perioral area  Assessment & Plan  Elastosis of skin the face  Botox 25 units injected as marked: - Frown complex 25 units   Botox Injection - the face Location: See attached image  Informed consent: Discussed risks (infection, pain, bleeding, bruising, swelling, allergic reaction, paralysis of nearby muscles, eyelid droop, double vision, neck weakness, difficulty breathing, headache, undesirable cosmetic result, and need for additional treatment) and benefits of the procedure, as well as the alternatives.  Informed consent was obtained.  Preparation: The area was cleansed with alcohol.  Procedure Details:  Botox was injected into the dermis with a 30-gauge needle. Pressure applied to any bleeding. Ice packs offered for swelling.  Lot Number:  [id number] Expiration: 07/2022  Total Units Injected:  25  Plan: Patient was instructed to remain upright for 4 hours. Patient was instructed to avoid massaging the face and avoid vigorous exercise for the  rest of the day. Tylenol may be used for headache.  Allow 2 weeks before returning to clinic for additional dosing as needed. Patient will call for any problems.   Perioral dermatitis Face Patient experienced hives when she used Doxycyline a couple of months ago. Will avoid Doxycycline. If she needs an oral treatment, will consider Z-pack.  Start [facility] Triple cream BID until clear. Then decrease use to QD. Azelaic Acid: 15%,Ivermectin: 1%, Metronidazole: 1%  Return for Botox follow up in one month; Botox injection in 3-4 months.  Luther Redo, CMA, am acting as scribe for Sarina Ser, MD .  Documentation: I have reviewed the above documentation for accuracy and completeness, and I agree with the above.  Sarina Ser, MD

## 2020-06-03 NOTE — Patient Instructions (Addendum)
Instructions for [facility] Medications  One or more of your medications was sent to the [facility] mail order compounding pharmacy. You will receive an email from them and can purchase the medicine through that link. It will then be mailed to your home at the address you confirmed. If for any reason you do not receive an email from them, please check your spam folder. If you still do not find the email, please let us know. [facility] phone number is [phone].   

## 2020-06-07 ENCOUNTER — Encounter: Payer: Self-pay | Admitting: Dermatology

## 2020-06-25 ENCOUNTER — Other Ambulatory Visit: Payer: Self-pay

## 2020-06-25 ENCOUNTER — Ambulatory Visit (INDEPENDENT_AMBULATORY_CARE_PROVIDER_SITE_OTHER): Payer: Self-pay | Admitting: Dermatology

## 2020-06-25 ENCOUNTER — Encounter: Payer: Self-pay | Admitting: Dermatology

## 2020-06-25 DIAGNOSIS — L988 Other specified disorders of the skin and subcutaneous tissue: Secondary | ICD-10-CM

## 2020-06-25 NOTE — Progress Notes (Signed)
   Follow-Up Visit   Subjective  Brittany Horne is a 55 y.o. female who presents for the following: Facial Elastosis (3 week recheck botox. Pt feels that she may need more at the frown line. ). Post 3 weeks Botox, much improved.  The following portions of the chart were reviewed this encounter and updated as appropriate:  Tobacco  Allergies  Meds  Problems  Med Hx  Surg Hx  Fam Hx     Review of Systems: No other skin or systemic complaints except as noted in HPI or Assessment and Plan.  Objective  Well appearing patient in no apparent distress; mood and affect are within normal limits.  A focused examination was performed including face. Relevant physical exam findings are noted in the Assessment and Plan.  Objective  face: Rhytides and volume loss.   Images        Assessment & Plan  Elastosis of skin face Discussed that best results will come with consistent Botox injections over a year.   Discussed doing lip fillers in the future. Would plan to do Restylane Refyne or Volbella at the upper and lower vermilion lip and upper lip lines and at the corners of the mouth.  Advised pt $650/syringe and can last 6 months - 1 year.   Botox Injection - face Location: See attached image  Informed consent: Discussed risks (infection, pain, bleeding, bruising, swelling, allergic reaction, paralysis of nearby muscles, eyelid droop, double vision, neck weakness, difficulty breathing, headache, undesirable cosmetic result, and need for additional treatment) and benefits of the procedure, as well as the alternatives.  Informed consent was obtained.  Preparation: The area was cleansed with alcohol.  Procedure Details:  Botox was injected into the dermis with a 30-gauge needle. Pressure applied to any bleeding. Ice packs offered for swelling.  Lot Number:  [id number] Expiration:  08/2022  Total Units Injected:  10  Plan: Patient was instructed to remain upright for 4 hours. Patient  was instructed to avoid massaging the face and avoid vigorous exercise for the rest of the day. Tylenol may be used for headache.  Allow 2 weeks before returning to clinic for additional dosing as needed. Patient will call for any problems.  Return in about 3 months (around 09/25/2020) for botox.   IHarriett Sine, CMA, am acting as scribe for Sarina Ser, MD.  Documentation: I have reviewed the above documentation for accuracy and completeness, and I agree with the above.  Sarina Ser, MD

## 2020-08-25 ENCOUNTER — Encounter: Payer: 59 | Admitting: Dermatology

## 2020-10-01 ENCOUNTER — Ambulatory Visit (INDEPENDENT_AMBULATORY_CARE_PROVIDER_SITE_OTHER): Payer: Self-pay | Admitting: Dermatology

## 2020-10-01 ENCOUNTER — Other Ambulatory Visit: Payer: Self-pay

## 2020-10-01 DIAGNOSIS — L710 Perioral dermatitis: Secondary | ICD-10-CM

## 2020-10-01 DIAGNOSIS — L988 Other specified disorders of the skin and subcutaneous tissue: Secondary | ICD-10-CM

## 2020-10-01 NOTE — Progress Notes (Signed)
   Follow-Up Visit   Subjective  Brittany Horne is a 55 y.o. female who presents for the following: Facial Elastosis (Patient is here today for Botox injections.).  The following portions of the chart were reviewed this encounter and updated as appropriate:   Tobacco  Allergies  Meds  Problems  Med Hx  Surg Hx  Fam Hx      Review of Systems:  No other skin or systemic complaints except as noted in HPI or Assessment and Plan.  Objective  Well appearing patient in no apparent distress; mood and affect are within normal limits.  A focused examination was performed including the face. Relevant physical exam findings are noted in the Assessment and Plan.  Face Rhytides and volume loss.   Face Clear today.  Assessment & Plan  Elastosis of skin Face  Botox Injection - Face Location: See attached image  Informed consent: Discussed risks (infection, pain, bleeding, bruising, swelling, allergic reaction, paralysis of nearby muscles, eyelid droop, double vision, neck weakness, difficulty breathing, headache, undesirable cosmetic result, and need for additional treatment) and benefits of the procedure, as well as the alternatives.  Informed consent was obtained.  Preparation: The area was cleansed with alcohol.  Procedure Details:  Botox was injected into the dermis with a 30-gauge needle. Pressure applied to any bleeding. Ice packs offered for swelling.  Lot Number:  [id number] Expiration:  08/2022  Total Units Injected:  35  Plan: Patient was instructed to remain upright for 4 hours. Patient was instructed to avoid massaging the face and avoid vigorous exercise for the rest of the day. Tylenol may be used for headache.  Allow 2 weeks before returning to clinic for additional dosing as needed. Patient will call for any problems.   Perioral dermatitis Face  Continue Skin Medic[facility] mix QD PRN flares.   Return in about 3 months (around 01/01/2021) for Botox injections.  Luther Redo, CMA, am acting as scribe for Sarina Ser, MD .  Documentation: I have reviewed the above documentation for accuracy and completeness, and I agree with the above.  Sarina Ser, MD

## 2020-10-01 NOTE — Patient Instructions (Addendum)
If you have any questions or concerns for your doctor, please call our main line at [phone] and press option 4 to reach your doctor's medical assistant. If no one answers, please leave a voicemail as directed and we will return your call as soon as possible. Messages left after 4 pm will be answered the following business day.   You may also send us a message via MyChart. We typically respond to MyChart messages within 1-2 business days.  For prescription refills, please ask your pharmacy to contact our office. Our fax number is [phone].  If you have an urgent issue when the clinic is closed that cannot wait until the next business day, you can page your doctor at the number below.    Please note that while we do our best to be available for urgent issues outside of office hours, we are not available 24/7.   If you have an urgent issue and are unable to reach us, you may choose to seek medical care at your doctor's office, retail clinic, urgent care center, or emergency room.  If you have a medical emergency, please immediately call 911 or go to the emergency department.  Pager Numbers  - Dr. Kowalski: [phone]  - Dr. Moye: [phone]  - Dr. Stewart: [phone]  In the event of inclement weather, please call our main line at [phone] for an update on the status of any delays or closures.  Dermatology Medication Tips: Please keep the boxes that topical medications come in in order to help keep track of the instructions about where and how to use these. Pharmacies typically print the medication instructions only on the boxes and not directly on the medication tubes.   If your medication is too expensive, please contact our office at [phone] option 4 or send us a message through MyChart.   We are unable to tell what your co-pay for medications will be in advance as this is different depending on your insurance coverage. However, we may be able to find a substitute  medication at lower cost or fill out paperwork to get insurance to cover a needed medication.   If a prior authorization is required to get your medication covered by your insurance company, please allow us 1-2 business days to complete this process.  Drug prices often vary depending on where the prescription is filled and some pharmacies may offer cheaper prices.  The website www.goodrx.com contains coupons for medications through different pharmacies. The prices here do not account for what the cost may be with help from insurance (it may be cheaper with your insurance), but the website can give you the price if you did not use any insurance.  - You can print the associated coupon and take it with your prescription to the pharmacy.  - You may also stop by our office during regular business hours and pick up a GoodRx coupon card.  - If you need your prescription sent electronically to a different pharmacy, notify our office through [facility] MyChart or by phone at [phone] option 4.  Instructions for [facility] Medications  One or more of your medications was sent to the [facility] mail order compounding pharmacy. You will receive an email from them and can purchase the medicine through that link. It will then be mailed to your home at the address you confirmed. If for any reason you do not receive an email from them, please check your spam folder. If you still do not find the email,   please let us know. [facility] phone number is [phone].   

## 2020-10-03 ENCOUNTER — Encounter: Payer: Self-pay | Admitting: Dermatology

## 2021-01-07 ENCOUNTER — Ambulatory Visit (INDEPENDENT_AMBULATORY_CARE_PROVIDER_SITE_OTHER): Payer: Self-pay | Admitting: Dermatology

## 2021-01-07 ENCOUNTER — Other Ambulatory Visit: Payer: Self-pay

## 2021-01-07 DIAGNOSIS — L988 Other specified disorders of the skin and subcutaneous tissue: Secondary | ICD-10-CM

## 2021-01-07 NOTE — Patient Instructions (Signed)

## 2021-01-07 NOTE — Progress Notes (Signed)
   Follow-Up Visit   Subjective  Brittany Horne is a 55 y.o. female who presents for the following: Facial Elastosis (Face, pt presents for Botox today).   The following portions of the chart were reviewed this encounter and updated as appropriate:   Tobacco  Allergies  Meds  Problems  Med Hx  Surg Hx  Fam Hx      Review of Systems:  No other skin or systemic complaints except as noted in HPI or Assessment and Plan.  Objective  Well appearing patient in no apparent distress; mood and affect are within normal limits.  A focused examination was performed including face. Relevant physical exam findings are noted in the Assessment and Plan.  face Rhytides and volume loss.       Assessment & Plan  Elastosis of skin face  Botox 35 units injected today to: - Frown complex 35 units  Filling material injection - face Location: Frown complex  Informed consent: Discussed risks (infection, pain, bleeding, bruising, swelling, allergic reaction, paralysis of nearby muscles, eyelid droop, double vision, neck weakness, difficulty breathing, headache, undesirable cosmetic result, and need for additional treatment) and benefits of the procedure, as well as the alternatives.  Informed consent was obtained.  Preparation: The area was cleansed with alcohol.  Procedure Details:  Botox was injected into the dermis with a 30-gauge needle. Pressure applied to any bleeding. Ice packs offered for swelling.  Lot Number:  [id number] Expiration:  10/2022  Total Units Injected:  35  Plan: Patient was instructed to remain upright for 4 hours. Patient was instructed to avoid massaging the face and avoid vigorous exercise for the rest of the day. Tylenol may be used for headache.  Allow 2 weeks before returning to clinic for additional dosing as needed. Patient will call for any problems.   Return for 3-8mBotox.  I, SOthelia Pulling RMA, am acting as scribe for DSarina Ser MD . Documentation:  I have reviewed the above documentation for accuracy and completeness, and I agree with the above.  DSarina Ser MD

## 2021-01-09 ENCOUNTER — Encounter: Payer: Self-pay | Admitting: Dermatology

## 2021-01-12 ENCOUNTER — Ambulatory Visit: Payer: 59 | Admitting: Dermatology

## 2021-02-13 HISTORY — PX: DENTAL SURGERY: SHX609

## 2021-02-23 ENCOUNTER — Encounter: Payer: Self-pay | Admitting: Podiatry

## 2021-03-03 ENCOUNTER — Other Ambulatory Visit: Payer: Self-pay | Admitting: Podiatry

## 2021-03-09 NOTE — Discharge Instructions (Signed)
[facility]  POST OPERATIVE INSTRUCTIONS FOR DR. FOWLER AND [facility]   Take your medication as prescribed.  Pain medication should be taken only as needed.  Keep the dressing clean, dry and intact.  Keep your foot elevated above the heart level for the first 48 hours.  Walking to the bathroom and brief periods of walking are acceptable, unless we have instructed you to be non-weight bearing.  Always wear your post-op shoe when walking.  Always use your crutches if you are to be non-weight bearing.  Do not take a shower. Baths are permissible as long as the foot is kept out of the water.   Every hour you are awake:  Bend your knee 15 times. Flex foot 15 times Massage calf 15 times  Call [facility] ([phone]) if any of the following problems occur: You develop a temperature or fever. The bandage becomes saturated with blood. Medication does not stop your pain. Injury of the foot occurs. Any symptoms of infection including redness, odor, or red streaks running from wound. 

## 2021-03-11 ENCOUNTER — Ambulatory Visit
Admission: RE | Admit: 2021-03-11 | Discharge: 2021-03-11 | Disposition: A | Discharge status: Home / Self Care | Payer: 59 | Attending: Podiatry | Admitting: Podiatry

## 2021-03-11 ENCOUNTER — Ambulatory Visit: Payer: 59 | Admitting: Anesthesiology

## 2021-03-11 ENCOUNTER — Other Ambulatory Visit: Payer: Self-pay

## 2021-03-11 ENCOUNTER — Encounter
Admission: RE | Disposition: A | Discharge status: Home / Self Care | Payer: Self-pay | Source: Home / Self Care | Attending: Podiatry

## 2021-03-11 ENCOUNTER — Encounter: Payer: Self-pay | Admitting: Podiatry

## 2021-03-11 DIAGNOSIS — Q600 Renal agenesis, unilateral: Secondary | ICD-10-CM | POA: Insufficient documentation

## 2021-03-11 DIAGNOSIS — M1990 Unspecified osteoarthritis, unspecified site: Secondary | ICD-10-CM | POA: Insufficient documentation

## 2021-03-11 DIAGNOSIS — F419 Anxiety disorder, unspecified: Secondary | ICD-10-CM | POA: Insufficient documentation

## 2021-03-11 DIAGNOSIS — D649 Anemia, unspecified: Secondary | ICD-10-CM | POA: Diagnosis not present

## 2021-03-11 DIAGNOSIS — M2012 Hallux valgus (acquired), left foot: Secondary | ICD-10-CM | POA: Insufficient documentation

## 2021-03-11 DIAGNOSIS — Z85038 Personal history of other malignant neoplasm of large intestine: Secondary | ICD-10-CM | POA: Insufficient documentation

## 2021-03-11 HISTORY — PX: BUNIONECTOMY: SHX129

## 2021-03-11 HISTORY — DX: Sacrococcygeal disorders, not elsewhere classified: M53.3

## 2021-03-11 SURGERY — BUNIONECTOMY
Anesthesia: General | Site: Toe | Laterality: Left

## 2021-03-11 MED ORDER — OXYCODONE HCL 5 MG PO TABS
5.0000 mg | ORAL_TABLET | Freq: Once | ORAL | Status: AC | PRN
  Administered 2021-03-11: 5 mg via ORAL

## 2021-03-11 MED ORDER — BUPIVACAINE LIPOSOME 1.3 % IJ SUSP
INTRAMUSCULAR | Status: DC | PRN
  Administered 2021-03-11 (×2): 10 mL

## 2021-03-11 MED ORDER — FENTANYL CITRATE (PF) 100 MCG/2ML IJ SOLN
INTRAMUSCULAR | Status: DC | PRN
  Administered 2021-03-11: 50 ug via INTRAVENOUS

## 2021-03-11 MED ORDER — CEFAZOLIN SODIUM-DEXTROSE 2-4 GM/100ML-% IV SOLN
2.0000 g | INTRAVENOUS | Status: AC
  Administered 2021-03-11: 2 g via INTRAVENOUS

## 2021-03-11 MED ORDER — ACETAMINOPHEN 160 MG/5ML PO SOLN: 325.0000 mg | ORAL | Status: DC | PRN

## 2021-03-11 MED ORDER — PROPOFOL 10 MG/ML IV BOLUS
INTRAVENOUS | Status: DC | PRN
  Administered 2021-03-11: 120 mg via INTRAVENOUS

## 2021-03-11 MED ORDER — LACTATED RINGERS IV SOLN: INTRAVENOUS | Status: DC

## 2021-03-11 MED ORDER — ONDANSETRON HCL 4 MG/2ML IJ SOLN
INTRAMUSCULAR | Status: DC | PRN
  Administered 2021-03-11: 4 mg via INTRAVENOUS

## 2021-03-11 MED ORDER — OXYCODONE HCL 5 MG/5ML PO SOLN: 5.0000 mg | Freq: Once | ORAL | Status: AC | PRN

## 2021-03-11 MED ORDER — OXYCODONE-ACETAMINOPHEN 5-325 MG PO TABS: 1.0000 | ORAL_TABLET | Freq: Four times a day (QID) | ORAL | 0 refills | Status: DC | PRN

## 2021-03-11 MED ORDER — FENTANYL CITRATE PF 50 MCG/ML IJ SOSY
25.0000 ug | PREFILLED_SYRINGE | INTRAMUSCULAR | Status: DC | PRN
  Administered 2021-03-11: 50 ug via INTRAVENOUS

## 2021-03-11 MED ORDER — GLYCOPYRROLATE 0.2 MG/ML IJ SOLN
INTRAMUSCULAR | Status: DC | PRN
  Administered 2021-03-11: .1 mg via INTRAVENOUS

## 2021-03-11 MED ORDER — LIDOCAINE HCL (CARDIAC) PF 100 MG/5ML IV SOSY
PREFILLED_SYRINGE | INTRAVENOUS | Status: DC | PRN
  Administered 2021-03-11: 50 mg via INTRATRACHEAL

## 2021-03-11 MED ORDER — DEXAMETHASONE SODIUM PHOSPHATE 4 MG/ML IJ SOLN
INTRAMUSCULAR | Status: DC | PRN
  Administered 2021-03-11: 4 mg via INTRAVENOUS

## 2021-03-11 MED ORDER — MIDAZOLAM HCL 5 MG/5ML IJ SOLN
INTRAMUSCULAR | Status: DC | PRN
  Administered 2021-03-11: 2 mg via INTRAVENOUS

## 2021-03-11 MED ORDER — BUPIVACAINE HCL (PF) 0.25 % IJ SOLN
INTRAMUSCULAR | Status: DC | PRN
  Administered 2021-03-11: 10 mL

## 2021-03-11 MED ORDER — SODIUM CHLORIDE 0.9 % IR SOLN
Status: DC | PRN
  Administered 2021-03-11: 500 mL

## 2021-03-11 MED ORDER — ACETAMINOPHEN 325 MG PO TABS: 325.0000 mg | ORAL_TABLET | ORAL | Status: DC | PRN

## 2021-03-11 SURGICAL SUPPLY — 44 items
APL SKNCLS STERI-STRIP NONHPOA (GAUZE/BANDAGES/DRESSINGS) ×1
BENZOIN TINCTURE PRP APPL 2/3 (GAUZE/BANDAGES/DRESSINGS) ×3 IMPLANT
BLADE SAW LAPIPLASTY 40X11 (BLADE) ×2 IMPLANT
BLADE SURG 15 STRL LF DISP TIS (BLADE) IMPLANT
BLADE SURG 15 STRL SS (BLADE) ×3
BNDG CMPR 75X41 PLY HI ABS (GAUZE/BANDAGES/DRESSINGS) ×1
BNDG COHESIVE 4X5 TAN ST LF (GAUZE/BANDAGES/DRESSINGS) ×3 IMPLANT
BNDG ELASTIC 4X5.8 VLCR STR LF (GAUZE/BANDAGES/DRESSINGS) ×3 IMPLANT
BNDG ESMARK 4X12 TAN STRL LF (GAUZE/BANDAGES/DRESSINGS) ×3 IMPLANT
BNDG GAUZE ELAST 4 BULKY (GAUZE/BANDAGES/DRESSINGS) ×3 IMPLANT
BNDG STRETCH 4X75 STRL LF (GAUZE/BANDAGES/DRESSINGS) ×3 IMPLANT
BOOT STEPPER DURA MED (SOFTGOODS) ×2 IMPLANT
CANISTER SUCT 1200ML W/VALVE (MISCELLANEOUS) ×3 IMPLANT
CLOSURE WOUND 1/4X4 (GAUZE/BANDAGES/DRESSINGS) ×1
COVER LIGHT HANDLE UNIVERSAL (MISCELLANEOUS) ×6 IMPLANT
DRAPE FLUOR MINI C-ARM 54X84 (DRAPES) ×3 IMPLANT
DURAPREP 26ML APPLICATOR (WOUND CARE) ×3 IMPLANT
ELECT REM PT RETURN 9FT ADLT (ELECTROSURGICAL) ×3
ELECTRODE REM PT RTRN 9FT ADLT (ELECTROSURGICAL) ×1 IMPLANT
GAUZE SPONGE 4X4 12PLY STRL (GAUZE/BANDAGES/DRESSINGS) ×3 IMPLANT
GAUZE XEROFORM 1X8 LF (GAUZE/BANDAGES/DRESSINGS) ×3 IMPLANT
GLOVE SRG 8 PF TXTR STRL LF DI (GLOVE) ×2 IMPLANT
GLOVE SURG ENC MOIS LTX SZ7.5 (GLOVE) ×6 IMPLANT
GLOVE SURG UNDER POLY LF SZ8 (GLOVE) ×6
GOWN STRL REUS W/ TWL LRG LVL3 (GOWN DISPOSABLE) ×2 IMPLANT
GOWN STRL REUS W/TWL LRG LVL3 (GOWN DISPOSABLE) ×6
KIT TURNOVER KIT A (KITS) ×3 IMPLANT
LAPIPLASTY SYS 4A (Orthopedic Implant) ×3 IMPLANT
NS IRRIG 500ML POUR BTL (IV SOLUTION) ×3 IMPLANT
PACK EXTREMITY ARMC (MISCELLANEOUS) ×3 IMPLANT
PENCIL SMOKE EVACUATOR (MISCELLANEOUS) ×3 IMPLANT
RASP SM TEAR CROSS CUT (RASP) ×2 IMPLANT
SCREW 2.7 HIGH PITCH LOCKING (Screw) ×2 IMPLANT
SCREW HIGH PITCH LOCK 2.7 (Screw) ×2 IMPLANT
STOCKINETTE IMPERVIOUS LG (DRAPES) ×3 IMPLANT
STRIP CLOSURE SKIN 1/4X4 (GAUZE/BANDAGES/DRESSINGS) ×2 IMPLANT
SUT ETHILON 3-0 (SUTURE) ×2 IMPLANT
SUT MNCRL 4-0 (SUTURE) ×3
SUT MNCRL 4-0 27XMFL (SUTURE) ×1
SUT VIC AB 3-0 SH 27 (SUTURE) ×6
SUT VIC AB 3-0 SH 27X BRD (SUTURE) IMPLANT
SUT VIC AB 4-0 FS2 27 (SUTURE) ×2 IMPLANT
SUTURE MNCRL 4-0 27XMF (SUTURE) IMPLANT
SYSTEM LAPIPLASTY 4A (Orthopedic Implant) IMPLANT

## 2021-03-11 NOTE — Anesthesia Postprocedure Evaluation (Signed)
Anesthesia Post Note  Patient: Brittany Horne  Procedure(s) Performed: [name] (Left: Toe)     Patient location during evaluation: PACU Anesthesia Type: General Level of consciousness: awake Pain management: pain level controlled Vital Signs Assessment: post-procedure vital signs reviewed and stable Respiratory status: respiratory function stable Cardiovascular status: stable Postop Assessment: no signs of nausea or vomiting Anesthetic complications: no   No notable events documented.  Veda Canning

## 2021-03-11 NOTE — Anesthesia Procedure Notes (Signed)
Procedure Name: LMA Insertion Date/Time: 03/11/2021 11:07 AM Performed by: Mayme Genta, CRNA Pre-anesthesia Checklist: Patient identified, Emergency Drugs available, Suction available, Timeout performed and Patient being monitored Patient Re-evaluated:Patient Re-evaluated prior to induction Oxygen Delivery Method: Circle system utilized Preoxygenation: Pre-oxygenation with 100% oxygen Induction Type: IV induction LMA: LMA inserted LMA Size: 4.0 Number of attempts: 1 Placement Confirmation: positive ETCO2 and breath sounds checked- equal and bilateral Tube secured with: Tape

## 2021-03-11 NOTE — Op Note (Signed)
Operative note   Surgeon:Mozelle Remlinger Lawyer: None    Preop diagnosis: Hallux valgus deformity left foot    Postop diagnosis: Same    Procedure: Lapidus hallux valgus correction left foot    EBL: Minimal    Anesthesia:local and general.  Local consisted of a total of 20 cc of Exparel long-acting anesthetic and 10 cc of 0.25% bupivacaine plain    Hemostasis: Mid calf tourniquet inflated to 200 mmHg for 87 minutes    Specimen: None    Complications: None    Operative indications:Adamariz F Vasconez is an 55 y.o. that presents today for surgical intervention.  The risks/benefits/alternatives/complications have been discussed and consent has been given.    Procedure:  Patient was brought into the OR and placed on the operating table in thesupine position. After anesthesia was obtained theleft lower extremity was prepped and draped in usual sterile fashion.  Attention was directed to the dorsal aspect of the foot where a dorsal incision was made at the first met cuneiforms joint.  Sharp and blunt dissection was carried down to the periosteum.  Subperiosteal dissection was then undertaken.  This exposed the first met cuneiform joint.  This was then freed and loosened.  A small fulcrum was placed between the base of the first metatarsal and second metatarsal.  Next the joint positioner was placed on the medial aspect of the metatarsal.  A small stab incision was made at the second metatarsal.  Compression was placed for the positioner.  Good realignment of the first intermetatarsal angle was noted at this time.  Attention was then directed to the dorsomedial first MTPJ where an incision was performed.  Sharp and blunt dissection was carried down to the capsule.  The intermetatarsal space was then entered.  The conjoined tendon of the abductor was then freed from the base of the proximal phalanx.  Attention was to redirected to the first met cuneiform joint.  At this time the osteotomy cut  guide was placed into the joint region.  2 vertical cuts were then made.  The cartilage material was removed from the first met cuneiform joint and the joint was then prepped with a 2.0 mm drill bit.  The joint compressor was then placed.  Good compression and realignment was noted.  Next the medial and dorsal locking plates were then placed from the [place] set.  Realignment and stability was noted in all planes.  Attention was redirected to the dorsomedial first MTPJ.  A small T capsulotomy was performed.  The dorsomedial eminence was noted and transected and smoothed with a power rasp.  A small capsulorrhaphy was performed.  Closure was then performed after all areas were irrigated.  3-0 Vicryl for the capsular tissue.  4-0 Vicryl in subcutaneous tissue and a 4-0 Monocryl for the skin.   Further local anesthesia was performed at the end of the case.    Patient tolerated the procedure and anesthesia well.  Was transported from the OR to the PACU with all vital signs stable and vascular status intact. To be discharged per routine protocol.  Will follow up in approximately 1 week in the outpatient clinic.

## 2021-03-11 NOTE — Transfer of Care (Signed)
Immediate Anesthesia Transfer of Care Note  Patient: Brittany Horne  Procedure(s) Performed: Lillard Anes LAPIDUS-TYPE (Left: Toe)  Patient Location: PACU  Anesthesia Type: General  Level of Consciousness: awake, alert  and patient cooperative  Airway and Oxygen Therapy: Patient Spontanous Breathing and Patient connected to supplemental oxygen  Post-op Assessment: Post-op Vital signs reviewed, Patient's Cardiovascular Status Stable, Respiratory Function Stable, Patent Airway and No signs of Nausea or vomiting  Post-op Vital Signs: Reviewed and stable  Complications: No notable events documented.

## 2021-03-11 NOTE — Anesthesia Preprocedure Evaluation (Signed)
Anesthesia Evaluation  Patient identified by MRN, date of birth, ID band Patient awake    Reviewed: Allergy & Precautions, NPO status   Airway Mallampati: II  TM Distance: >3 FB     Dental   Pulmonary    Pulmonary exam normal        Cardiovascular  Rhythm:Regular Rate:Normal     Neuro/Psych PSYCHIATRIC DISORDERS Anxiety    GI/Hepatic   Endo/Other    Renal/GU Renal disease (congenital single kidney)     Musculoskeletal  (+) Arthritis ,   Abdominal   Peds  Hematology  (+) anemia ,   Anesthesia Other Findings Hx colon cancer - 2015  Reproductive/Obstetrics                            Anesthesia Physical Anesthesia Plan  ASA: 2  Anesthesia Plan: General   Post-op Pain Management:    Induction: Intravenous  PONV Risk Score and Plan: Treatment may vary due to age or medical condition  Airway Management Planned: LMA  Additional Equipment:   Intra-op Plan:   Post-operative Plan:   Informed Consent: I have reviewed the patients History and Physical, chart, labs and discussed the procedure including the risks, benefits and alternatives for the proposed anesthesia with the patient or authorized representative who has indicated his/her understanding and acceptance.     Dental advisory given  Plan Discussed with: CRNA  Anesthesia Plan Comments:         Anesthesia Quick Evaluation

## 2021-03-11 NOTE — H&P (Signed)
HISTORY AND PHYSICAL INTERVAL NOTE:  03/11/2021  10:51 AM  Brittany Horne  has presented today for surgery, with the diagnosis of M20.12- Hallux valgus of left foot.  The various methods of treatment have been discussed with the patient.  No guarantees were given.  After consideration of risks, benefits and other options for treatment, the patient has consented to surgery.  I have reviewed the patients' chart and labs.     Horne history and physical examination was performed in my office.  The patient was reexamined.  There have been no changes to this history and physical examination.  Brittany Horne

## 2021-03-12 ENCOUNTER — Encounter: Payer: Self-pay | Admitting: Podiatry

## 2021-04-14 ENCOUNTER — Ambulatory Visit: Payer: Self-pay | Admitting: Dermatology

## 2021-04-28 ENCOUNTER — Encounter: Payer: Self-pay | Admitting: Dermatology

## 2021-04-28 ENCOUNTER — Ambulatory Visit (INDEPENDENT_AMBULATORY_CARE_PROVIDER_SITE_OTHER): Payer: Self-pay | Admitting: Dermatology

## 2021-04-28 ENCOUNTER — Other Ambulatory Visit: Payer: Self-pay

## 2021-04-28 DIAGNOSIS — L988 Other specified disorders of the skin and subcutaneous tissue: Secondary | ICD-10-CM

## 2021-04-28 NOTE — Patient Instructions (Signed)

## 2021-04-28 NOTE — Progress Notes (Signed)
° °  Follow-Up Visit   Subjective  Brittany Horne is a 56 y.o. female who presents for the following: Facial Elastosis (Patient is here today for Botox injections. ).  The following portions of the chart were reviewed this encounter and updated as appropriate:   Tobacco   Allergies   Meds   Problems   Med Hx   Surg Hx   Fam Hx      Review of Systems:  No other skin or systemic complaints except as noted in HPI or Assessment and Plan.  Objective  Well appearing patient in no apparent distress; mood and affect are within normal limits.  A focused examination was performed including the face. Relevant physical exam findings are noted in the Assessment and Plan.  Face Rhytides and volume loss.        Assessment & Plan  Elastosis of skin Face  Botox 35 units injected as marked: - Frown complex 35 units  Botox Injection - Face Location: See attached image  Informed consent: Discussed risks (infection, pain, bleeding, bruising, swelling, allergic reaction, paralysis of nearby muscles, eyelid droop, double vision, neck weakness, difficulty breathing, headache, undesirable cosmetic result, and need for additional treatment) and benefits of the procedure, as well as the alternatives.  Informed consent was obtained.  Preparation: The area was cleansed with alcohol.  Procedure Details:  Botox was injected into the dermis with a 30-gauge needle. Pressure applied to any bleeding. Ice packs offered for swelling.  Lot Number:  [id number] Expiration:  05/2023  Total Units Injected:  35  Plan: Patient was instructed to remain upright for 4 hours. Patient was instructed to avoid massaging the face and avoid vigorous exercise for the rest of the day. Tylenol may be used for headache.  Allow 2 weeks before returning to clinic for additional dosing as needed. Patient will call for any problems.    Return in about 4 months (around 08/26/2021) for Botox injections.  Luther Redo, CMA, am  acting as scribe for Sarina Ser, MD . Documentation: I have reviewed the above documentation for accuracy and completeness, and I agree with the above.  Sarina Ser, MD

## 2021-07-13 ENCOUNTER — Ambulatory Visit: Payer: 59 | Admitting: Dermatology

## 2021-08-11 ENCOUNTER — Ambulatory Visit (INDEPENDENT_AMBULATORY_CARE_PROVIDER_SITE_OTHER): Payer: Self-pay | Admitting: Dermatology

## 2021-08-11 VITALS — BP 134/76 | HR 64 | Wt 178.6 lb

## 2021-08-11 DIAGNOSIS — L988 Other specified disorders of the skin and subcutaneous tissue: Secondary | ICD-10-CM

## 2021-08-11 DIAGNOSIS — L700 Acne vulgaris: Secondary | ICD-10-CM

## 2021-08-11 MED ORDER — SPIRONOLACTONE 50 MG PO TABS: 50.0000 mg | ORAL_TABLET | Freq: Every morning | ORAL | 5 refills | Status: AC

## 2021-08-11 MED ORDER — AZELAIC ACID 15 % EX GEL: CUTANEOUS | 5 refills | Status: AC

## 2021-08-11 NOTE — Patient Instructions (Signed)

## 2021-08-11 NOTE — Progress Notes (Signed)
? ?Follow-Up Visit ?  ?Subjective  ?Brittany Horne is a 56 y.o. female who presents for the following: Facial Elastosis (Patient is here today for Botox injections.) and Acne (Patient currently using Skin Medicinals mix and would like refills. Pt states her acne has worsened and would like to discuss other treatment options./). ? ?The following portions of the chart were reviewed this encounter and updated as appropriate:  ? Tobacco  Allergies  Meds  Problems  Med Hx  Surg Hx  Fam Hx   ?  ?Review of Systems:  No other skin or systemic complaints except as noted in HPI or Assessment and Plan. ? ?Objective  ?Well appearing patient in no apparent distress; mood and affect are within normal limits. ? ?A focused examination was performed including the face. Relevant physical exam findings are noted in the Assessment and Plan. ? ?Face ?Rhytides and volume loss.  ? ? ? ? ?Face ?Multiple papules of the face.  ? ? ?Assessment & Plan  ?Elastosis of skin ?Face ?Botox 35 units injected into the frown complex (see diagram). ?Botox Injection - Face ?Location: See attached image ? ?Informed consent: Discussed risks (infection, pain, bleeding, bruising, swelling, allergic reaction, paralysis of nearby muscles, eyelid droop, double vision, neck weakness, difficulty breathing, headache, undesirable cosmetic result, and need for additional treatment) and benefits of the procedure, as well as the alternatives.  Informed consent was obtained. ? ?Preparation: The area was cleansed with alcohol. ? ?Procedure Details:  Botox was injected into the dermis with a 30-gauge needle. Pressure applied to any bleeding. Ice packs offered for swelling. ? ?Lot Number:  [id number] ?Expiration:  07/2023 ? ?Total Units Injected:  35 ? ?Plan: Patient was instructed to remain upright for 4 hours. Patient was instructed to avoid massaging the face and avoid vigorous exercise for the rest of the day. Tylenol may be used for headache.  Allow 2 weeks  before returning to clinic for additional dosing as needed. Patient will call for any problems. ? ?Acne vulgaris ?Face ?Chronic and persistent condition with duration or expected duration over one year. Condition is symptomatic / bothersome to patient. Not to goal. ?Discussed Spironolactone  ?recommend starting Spironolactone '50mg'$  po QAM.  ?Spironolactone can cause increased urination and cause blood pressure to decrease. Please watch for signs of lightheadedness and be cautious when changing position. It can sometimes cause breast tenderness or an irregular period in premenopausal women. It can also increase potassium. The increase in potassium usually is not a concern unless you are taking other medicines that also increase potassium, so please be sure your doctor knows all of the other medications you are taking. This medication should not be taken by pregnant women.  This medicine should also not be taken together with sulfa drugs like Bactrim (trimethoprim/sulfamethexazole).  ? ?Patient had a possible reaction to Doxycycline (rash), but she also had Covid around the same time and is unsure which caused the rash.  ? ?Start Azelaic acid gel QHS.  ? ?If not improving in 6 weeks patient to contact office for further instruction.  ? ?spironolactone (ALDACTONE) 50 MG tablet - Face ?Take 1 tablet (50 mg total) by mouth every morning. ? ?Azelaic Acid 15 % gel - Face ?Apply a thin coat to the face QHS. ? ?Return in about 3 months (around 11/10/2021) for botox injections. ? ?I, Rudell Cobb, CMA, am acting as scribe for Sarina Ser, MD . ?Documentation: I have reviewed the above documentation for accuracy and completeness, and I agree  with the above. ? ?Sarina Ser, MD ? ?

## 2021-08-18 ENCOUNTER — Encounter: Payer: Self-pay | Admitting: Dermatology

## 2021-10-21 ENCOUNTER — Other Ambulatory Visit: Payer: Self-pay | Admitting: Obstetrics and Gynecology

## 2021-10-21 DIAGNOSIS — Z1231 Encounter for screening mammogram for malignant neoplasm of breast: Secondary | ICD-10-CM

## 2021-12-02 ENCOUNTER — Ambulatory Visit: Payer: Self-pay | Admitting: Dermatology

## 2023-01-24 ENCOUNTER — Other Ambulatory Visit: Payer: Self-pay | Admitting: Family Medicine

## 2023-01-24 DIAGNOSIS — Z1231 Encounter for screening mammogram for malignant neoplasm of breast: Secondary | ICD-10-CM

## 2023-02-07 ENCOUNTER — Ambulatory Visit
Admission: RE | Admit: 2023-02-07 | Discharge: 2023-02-07 | Disposition: A | Discharge status: Home / Self Care | Payer: 59 | Source: Ambulatory Visit | Attending: Family Medicine | Admitting: Family Medicine

## 2023-02-07 DIAGNOSIS — Z1231 Encounter for screening mammogram for malignant neoplasm of breast: Secondary | ICD-10-CM | POA: Insufficient documentation

## 2023-02-08 ENCOUNTER — Other Ambulatory Visit: Payer: Self-pay | Admitting: *Deleted

## 2023-02-08 ENCOUNTER — Inpatient Hospital Stay
Admission: RE | Admit: 2023-02-08 | Discharge: 2023-02-08 | Disposition: A | Discharge status: Home / Self Care | Payer: Self-pay | Source: Ambulatory Visit | Attending: Family Medicine | Admitting: Family Medicine

## 2023-02-08 DIAGNOSIS — Z1231 Encounter for screening mammogram for malignant neoplasm of breast: Secondary | ICD-10-CM

## 2023-08-01 ENCOUNTER — Ambulatory Visit
Admission: RE | Admit: 2023-08-01 | Discharge: 2023-08-01 | Disposition: A | Discharge status: Home / Self Care | Source: Ambulatory Visit | Attending: Emergency Medicine | Admitting: Emergency Medicine

## 2023-08-01 VITALS — BP 140/79 | HR 70 | Temp 97.9°F | Resp 18

## 2023-08-01 DIAGNOSIS — J0140 Acute pansinusitis, unspecified: Secondary | ICD-10-CM

## 2023-08-01 MED ORDER — PREDNISONE 10 MG (21) PO TBPK: ORAL_TABLET | ORAL | 0 refills | Status: AC

## 2023-08-01 MED ORDER — FLUTICASONE PROPIONATE 50 MCG/ACT NA SUSP: 2.0000 | Freq: Every day | NASAL | 0 refills | Status: AC

## 2023-08-01 MED ORDER — AMOXICILLIN-POT CLAVULANATE 875-125 MG PO TABS: 1.0000 | ORAL_TABLET | Freq: Two times a day (BID) | ORAL | 0 refills | Status: AC

## 2023-08-01 MED ORDER — IBUPROFEN 600 MG PO TABS: 600.0000 mg | ORAL_TABLET | Freq: Three times a day (TID) | ORAL | 0 refills | Status: AC | PRN

## 2023-08-01 NOTE — ED Triage Notes (Signed)
 Pt c/o sinus pressure, bilateral ear pain, headache, and blood-tinged and green mucus for over 1 week. Pt has tried nasal rinses, nasal spray and mucinex with no relief.

## 2023-08-01 NOTE — Discharge Instructions (Signed)
 Start Mucinex or Mucinex-D to keep the mucous thin and to decongest you.   You may take 600 mg of motrin with 1000 mg of tylenol up to 3-4 times a day as needed for pain. This is an effective combination for pain.  Finish the steroids and Augmentin, even if you feel better.  Flonase to help with the nasal swelling.  You have 1 more day where you can use Afrin.  Use a NeilMed sinus rinse with distilled water as often as you want to to reduce nasal congestion. Follow the directions on the box.   Go to www.goodrx.com to look up your medications. This will give you a list of where you can find your prescriptions at the most affordable prices. Or you can ask the pharmacist what the cash price is. This is frequently cheaper than going through insurance.

## 2023-08-01 NOTE — ED Provider Notes (Signed)
 HPI  SUBJECTIVE:  Brittany Horne is a 58 y.o. female who presents with 10 days of severe nasal congestion, brown, yellowish and bloody nasal rhinorrhea, sinus pain and pressure, bilateral ear pressure, constant headache, malaise, facial swelling, postnasal drip and cough secondary to postnasal drip.  Symptoms started after having a virus.  No upper dental pain, fevers wheezing, shortness of breath.  No antibiotics in the past month.  No antipyretic in the past 6 hours.  She has been taking Tylenol, Mucinex, Delsym, saline spray, and Afrin for the past 2 nights so that she can sleep.  The Delsym and Afrin helped.  Symptoms are worse with lying down.  She is unable to sleep at night because of the cough.  She has no past medical history.  [facility]P: [facility]    Past Medical History:  Diagnosis Date   Anemia    Hx of due to colon cancer   Anxiety    Arthritis    neck, knees, hands   Atypical mole 06/04/2019   Right posterior upper arm. AMP. Excised: 08/13/2019, margins free   Coccyx pain    S/p fall (approx 10/10)   Colon cancer (HCC) 2015   chemo   Colon cancer (HCC) 10/20/2014   COVID-19 04/07/2020   "mild body ache" on 04/08/20, mild cold symptoms   COVID-19 12/2020   passed out 1 evening. EMS said low HR and low BP.  Declined transport to hospital.  Still has mild fatigue(10/31/220   Kidney anomaly, congenital    only 1 kidney    Past Surgical History:  Procedure Laterality Date   ABDOMINAL HYSTERECTOMY  2008   AUGMENTATION MAMMAPLASTY Bilateral 2004   BUNIONECTOMY Left 03/11/2021   Procedure: Flavia Shipper;  Surgeon: Gwyneth Revels, DPM;  Location: [facility];  Service: Podiatry;  Laterality: Left;  GENERAL WITH LOCAL   CESAREAN SECTION  1993, 2000   X2   COLON SURGERY     colectomy   COLONOSCOPY WITH PROPOFOL N/A 09/05/2014   Procedure: COLONOSCOPY WITH PROPOFOL;  Surgeon: Elnita Maxwell, MD;  Location: [facility];  Service: Endoscopy;   Laterality: N/A;   COLONOSCOPY WITH PROPOFOL N/A 03/17/2020   Procedure: COLONOSCOPY WITH PROPOFOL;  Surgeon: Regis Bill, MD;  Location: [facility] ENDOSCOPY;  Service: Endoscopy;  Laterality: N/A;   CYSTOSCOPY Left    stent placement   DENTAL SURGERY  02/13/2021   Broken tooth   PORT-A-CATH REMOVAL  2016    History reviewed. No pertinent family history.  Social History   Tobacco Use   Smoking status: Never   Smokeless tobacco: Never  Vaping Use   Vaping status: Never Used  Substance Use Topics   Alcohol use: Yes    Alcohol/week: 4.0 standard drinks of alcohol    Types: 4 Standard drinks or equivalent per week   Drug use: No    No current facility-administered medications for this encounter.  Current Outpatient Medications:    amoxicillin-clavulanate (AUGMENTIN) 875-125 MG tablet, Take 1 tablet by mouth every 12 (twelve) hours., Disp: 14 tablet, Rfl: 0   fluticasone (FLONASE) 50 MCG/ACT nasal spray, Place 2 sprays into both nostrils daily., Disp: 16 g, Rfl: 0   ibuprofen (ADVIL) 600 MG tablet, Take 1 tablet (600 mg total) by mouth every 8 (eight) hours as needed., Disp: 30 tablet, Rfl: 0   predniSONE (STERAPRED UNI-PAK 21 TAB) 10 MG (21) TBPK tablet, Dispense one 6 day pack. Take as directed with food., Disp: 21 tablet, Rfl: 0  acetaminophen (TYLENOL) 500 MG tablet, Take 1,000 mg by mouth every 6 (six) hours as needed., Disp: , Rfl:    Azelaic Acid 15 % gel, Apply a thin coat to the face QHS., Disp: 50 g, Rfl: 5   b complex vitamins capsule, Take 1 capsule by mouth daily. Vit B6 and B12, Disp: , Rfl:    cholecalciferol (VITAMIN D3) 25 MCG (1000 UNIT) tablet, Take 1,000 Units by mouth daily., Disp: , Rfl:    estradiol (ESTRACE) 0.5 MG tablet, Take 0.5 mg by mouth daily., Disp: , Rfl:    Omega 3-6-9 Fatty Acids (OMEGA 3-6-9 COMPLEX PO), Take by mouth., Disp: , Rfl:    OVER THE COUNTER MEDICATION, Vegetable and juice supplement, Disp: , Rfl:    spironolactone (ALDACTONE) 50  MG tablet, Take 1 tablet (50 mg total) by mouth every morning., Disp: 30 tablet, Rfl: 5  Facility-Administered Medications Ordered in Other Encounters:    sodium chloride 0.9 % injection 10 mL, 10 mL, Intravenous, PRN, Sherrlyn Hock, Sandeep, MD   sodium chloride 0.9 % injection 10 mL, 10 mL, Intravenous, PRN, Sherrlyn Hock, Sandeep, MD, 10 mL at 09/25/14 1206  No Active Allergies   ROS  As noted in HPI.   Physical Exam  BP (!) 140/79 (BP Location: Left Arm)   Pulse 70   Temp 97.9 F (36.6 C) (Oral)   Resp 18   SpO2 96%   Constitutional: Well developed, well nourished, no acute distress Eyes:  EOMI, conjunctiva normal bilaterally HENT: Normocephalic, atraumatic,mucus membranes moist.  TMs normal bilaterally.  Purulent nasal congestion.  Erythematous, swollen turbinates.  Positive maxillary, frontal sinus tenderness.  Positive postnasal drip. Respiratory: Normal inspiratory effort, lungs clear bilaterally. Cardiovascular: Normal rate GI: nondistended skin: No rash, skin intact Musculoskeletal: no deformities Neurologic: Alert & oriented x 3, no focal neuro deficits Psychiatric: Speech and behavior appropriate   ED Course   Medications - No data to display  No orders of the defined types were placed in this encounter.   No results found for this or any previous visit (from the past 24 hours). No results found.  ED Clinical Impression  1. Acute non-recurrent pansinusitis      ED Assessment/Plan    Patient presents with an acute sinusitis.  She qualifies for antibiotics due to duration and severity of symptoms.  Home with Augmentin for 7 days, Flonase, 6-day prednisone taper due to the extent of her nasal congestion, saline nasal irrigation.  Ibuprofen/Tylenol 3-4 times a day as needed for pain.  Follow-up with PCP as needed.    Discussed MDM, treatment plan, and plan for follow-up with patient. patient agrees with plan.   Meds ordered this encounter  Medications    fluticasone (FLONASE) 50 MCG/ACT nasal spray    Sig: Place 2 sprays into both nostrils daily.    Dispense:  16 g    Refill:  0   ibuprofen (ADVIL) 600 MG tablet    Sig: Take 1 tablet (600 mg total) by mouth every 8 (eight) hours as needed.    Dispense:  30 tablet    Refill:  0   amoxicillin-clavulanate (AUGMENTIN) 875-125 MG tablet    Sig: Take 1 tablet by mouth every 12 (twelve) hours.    Dispense:  14 tablet    Refill:  0   predniSONE (STERAPRED UNI-PAK 21 TAB) 10 MG (21) TBPK tablet    Sig: Dispense one 6 day pack. Take as directed with food.    Dispense:  21 tablet  Refill:  0      *This clinic note was created using Scientist, clinical (histocompatibility and immunogenetics). Therefore, there may be occasional mistakes despite careful proofreading.  ?    Domenick Gong, MD 08/01/23 [phone]

## 2023-12-14 ENCOUNTER — Other Ambulatory Visit: Payer: Self-pay | Admitting: Family Medicine

## 2023-12-14 DIAGNOSIS — R1011 Right upper quadrant pain: Secondary | ICD-10-CM

## 2023-12-19 ENCOUNTER — Ambulatory Visit
Admission: RE | Admit: 2023-12-19 | Discharge: 2023-12-19 | Disposition: A | Discharge status: Home / Self Care | Source: Ambulatory Visit | Attending: Family Medicine | Admitting: Family Medicine

## 2023-12-19 DIAGNOSIS — R1011 Right upper quadrant pain: Secondary | ICD-10-CM | POA: Diagnosis present
